# Patient Record
Sex: Male | Born: 1980 | Race: Black or African American | Hispanic: No | Marital: Married | Smoking: Current every day smoker
Health system: Southern US, Community
[De-identification: ages and names within clinical notes are randomized; demographics above are authoritative.]

---

## 2005-08-11 ENCOUNTER — Emergency Department (HOSPITAL_COMMUNITY): Admission: EM | Admit: 2005-08-11 | Discharge: 2005-08-11 | Payer: Self-pay | Admitting: Emergency Medicine

## 2005-08-14 ENCOUNTER — Ambulatory Visit (HOSPITAL_COMMUNITY): Admission: RE | Admit: 2005-08-14 | Discharge: 2005-08-14 | Payer: Self-pay | Admitting: Chiropractic Medicine

## 2005-09-08 ENCOUNTER — Encounter: Admission: RE | Admit: 2005-09-08 | Discharge: 2005-09-08 | Payer: Self-pay | Admitting: Specialist

## 2005-09-11 ENCOUNTER — Emergency Department (HOSPITAL_COMMUNITY): Admission: EM | Admit: 2005-09-11 | Discharge: 2005-09-12 | Payer: Self-pay | Admitting: Emergency Medicine

## 2005-09-11 IMAGING — CR DG ANKLE COMPLETE 3+V*R*
3 series · 3 of 3 positions shown · non-contrast
Comparison: none

CLINICAL DATA: Right ankle pain after injury.
 RIGHT ANKLE- 3 VIEW:

[t ankle joint ap right]
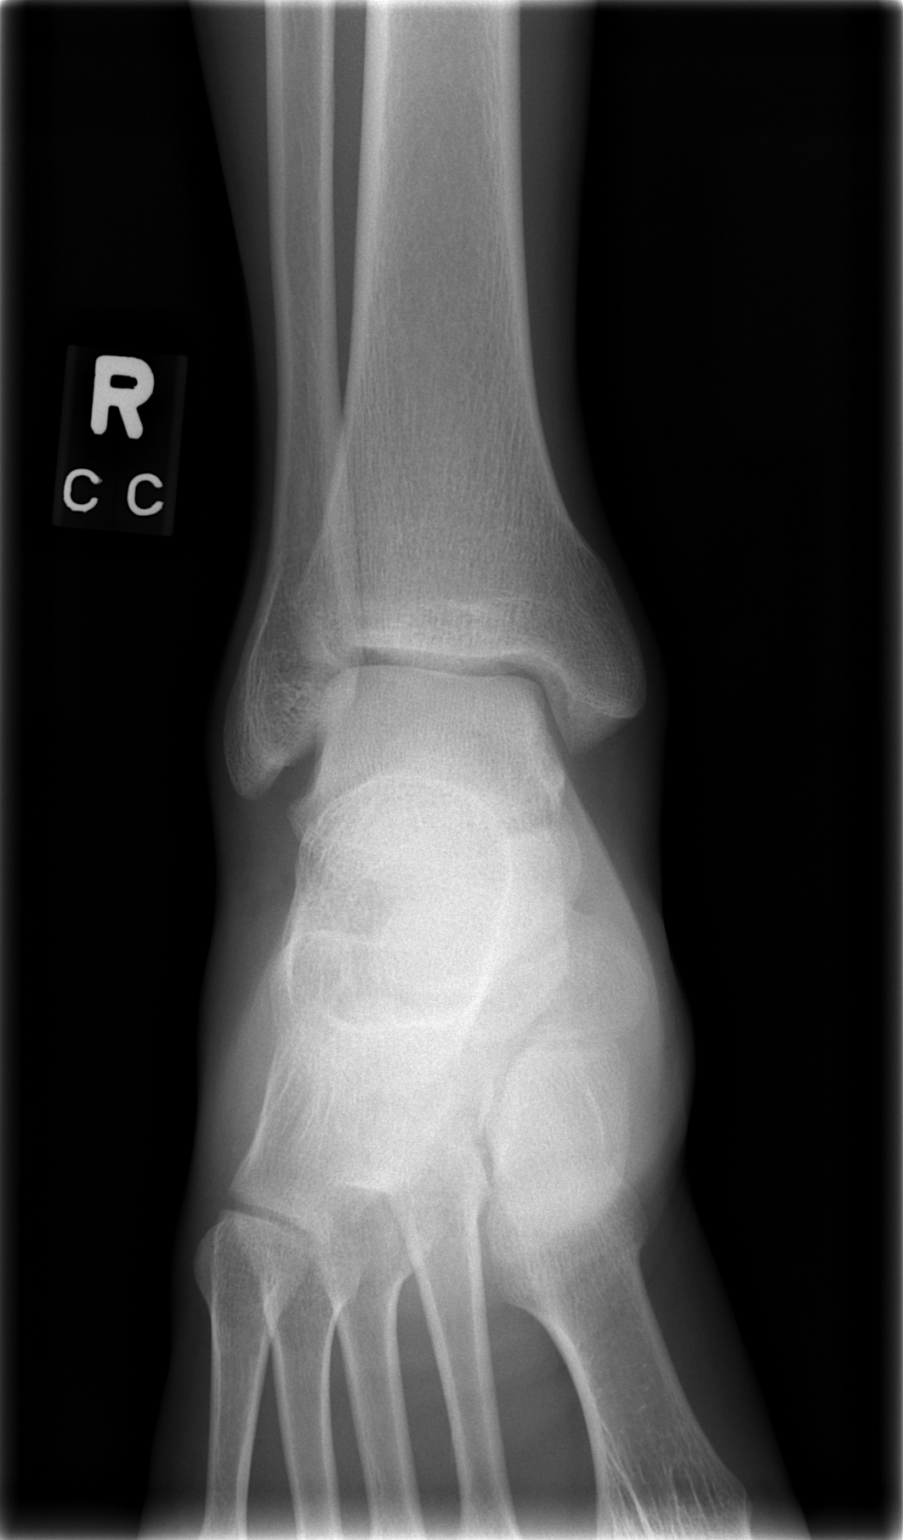

[t ankle joint oblique right]
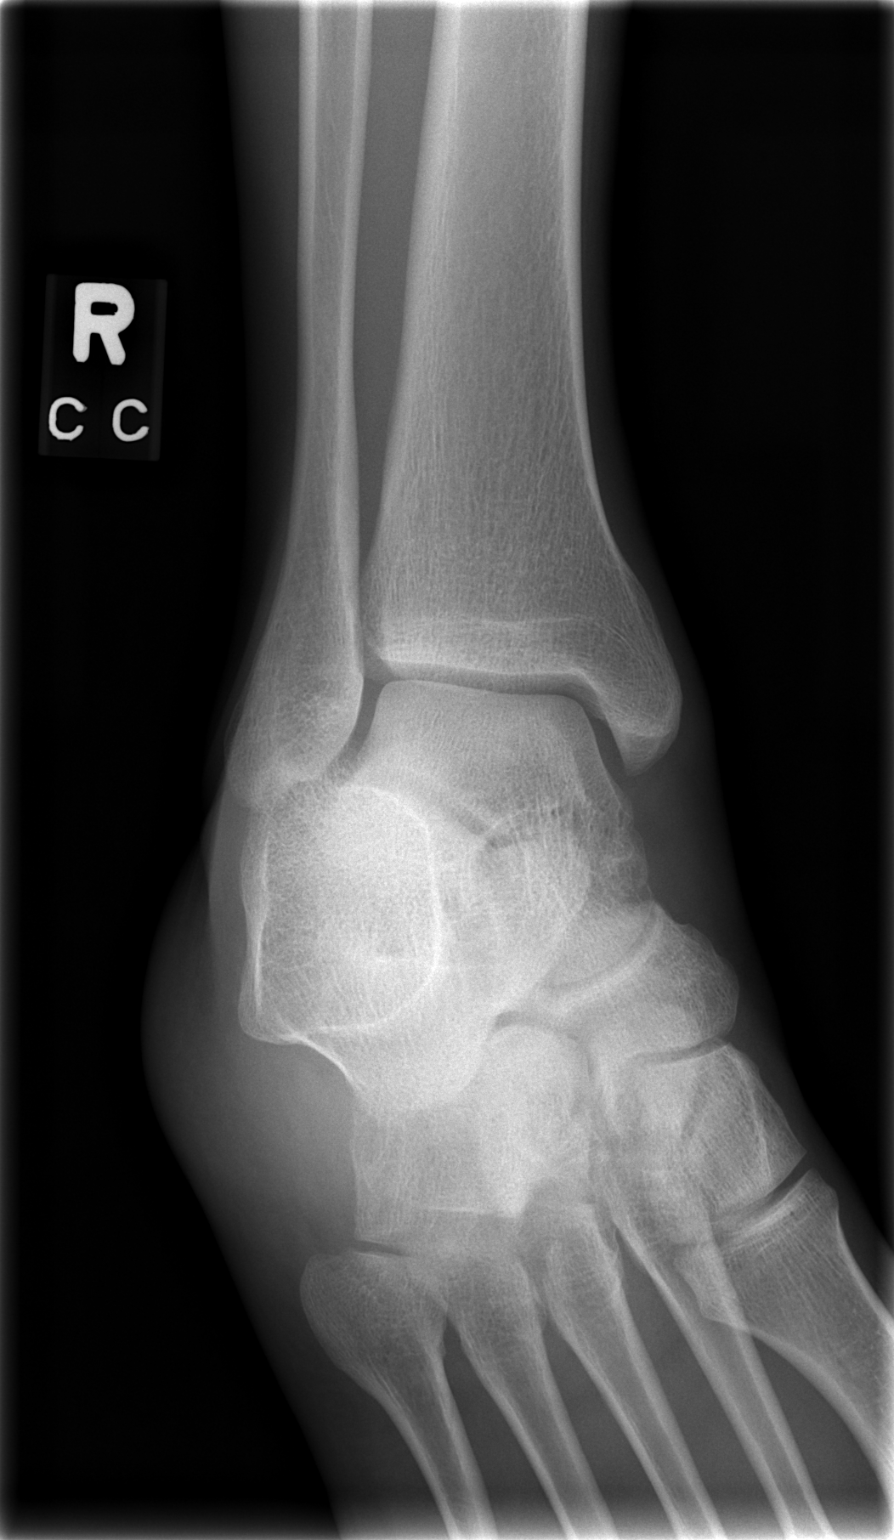

[t ankle joint lat right]
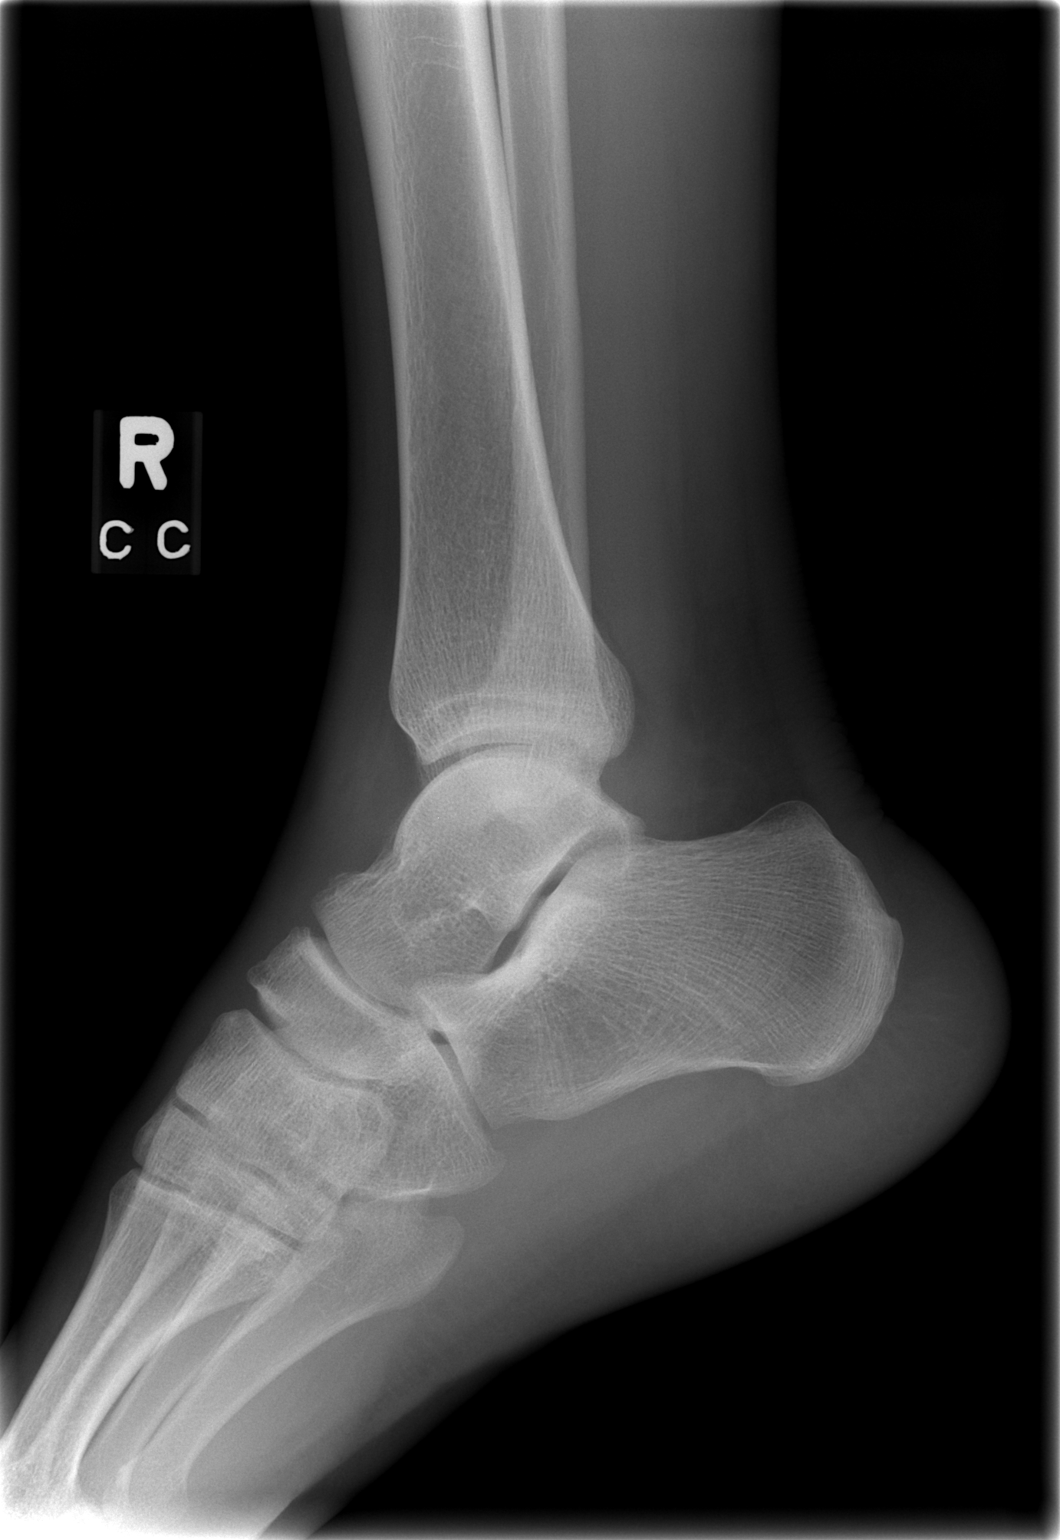

[3 of 3 positions shown; findings below may reference images not displayed]

FINDINGS: There is no evidence of fracture, dislocation, or joint effusion.  There is no evidence of arthropathy or other focal bone abnormality.  Soft tissues are unremarkable.
IMPRESSION: Negative.

## 2005-10-27 ENCOUNTER — Emergency Department (HOSPITAL_COMMUNITY): Admission: EM | Admit: 2005-10-27 | Discharge: 2005-10-27 | Payer: Self-pay | Admitting: *Deleted

## 2005-10-28 ENCOUNTER — Emergency Department (HOSPITAL_COMMUNITY): Admission: EM | Admit: 2005-10-28 | Discharge: 2005-10-28 | Payer: Self-pay | Admitting: Emergency Medicine

## 2006-05-15 ENCOUNTER — Ambulatory Visit: Payer: Self-pay | Admitting: Nurse Practitioner

## 2006-06-02 ENCOUNTER — Emergency Department (HOSPITAL_COMMUNITY): Admission: EM | Admit: 2006-06-02 | Discharge: 2006-06-02 | Payer: Self-pay | Admitting: Family Medicine

## 2006-07-30 ENCOUNTER — Emergency Department (HOSPITAL_COMMUNITY): Admission: EM | Admit: 2006-07-30 | Discharge: 2006-07-30 | Payer: Self-pay | Admitting: Emergency Medicine

## 2006-08-02 ENCOUNTER — Emergency Department (HOSPITAL_COMMUNITY): Admission: EM | Admit: 2006-08-02 | Discharge: 2006-08-02 | Payer: Self-pay | Admitting: Emergency Medicine

## 2006-12-15 ENCOUNTER — Encounter: Payer: Self-pay | Admitting: Infectious Diseases

## 2006-12-15 DIAGNOSIS — B199 Unspecified viral hepatitis without hepatic coma: Secondary | ICD-10-CM | POA: Insufficient documentation

## 2007-01-14 ENCOUNTER — Emergency Department (HOSPITAL_COMMUNITY): Admission: EM | Admit: 2007-01-14 | Discharge: 2007-01-14 | Payer: Self-pay | Admitting: Family Medicine

## 2007-04-05 ENCOUNTER — Emergency Department (HOSPITAL_COMMUNITY): Admission: EM | Admit: 2007-04-05 | Discharge: 2007-04-05 | Payer: Self-pay | Admitting: Emergency Medicine

## 2007-08-25 ENCOUNTER — Emergency Department (HOSPITAL_COMMUNITY): Admission: EM | Admit: 2007-08-25 | Discharge: 2007-08-25 | Payer: Self-pay | Admitting: Emergency Medicine

## 2007-11-12 ENCOUNTER — Emergency Department (HOSPITAL_COMMUNITY): Admission: EM | Admit: 2007-11-12 | Discharge: 2007-11-12 | Payer: Self-pay | Admitting: *Deleted

## 2007-12-16 ENCOUNTER — Ambulatory Visit: Payer: Self-pay | Admitting: Infectious Diseases

## 2007-12-16 ENCOUNTER — Encounter: Admission: RE | Admit: 2007-12-16 | Discharge: 2007-12-16 | Payer: Self-pay | Admitting: Infectious Diseases

## 2007-12-16 DIAGNOSIS — B20 Human immunodeficiency virus [HIV] disease: Secondary | ICD-10-CM | POA: Insufficient documentation

## 2007-12-16 DIAGNOSIS — B1910 Unspecified viral hepatitis B without hepatic coma: Secondary | ICD-10-CM | POA: Insufficient documentation

## 2007-12-16 LAB — CONVERTED CEMR LAB
ALT: 69 units/L — ABNORMAL HIGH (ref 0–53)
AST: 153 units/L — ABNORMAL HIGH (ref 0–37)
Albumin: 3.8 g/dL (ref 3.5–5.2)
Alkaline Phosphatase: 139 units/L — ABNORMAL HIGH (ref 39–117)
BUN: 14 mg/dL (ref 6–23)
Basophils Absolute: 0 10*3/uL (ref 0.0–0.1)
Basophils Relative: 1 % (ref 0–1)
Bilirubin Urine: NEGATIVE
CO2: 22 meq/L (ref 19–32)
Calcium: 9.1 mg/dL (ref 8.4–10.5)
Chlamydia, Swab/Urine, PCR: NEGATIVE
Chloride: 103 meq/L (ref 96–112)
Cholesterol: 189 mg/dL (ref 0–200)
Creatinine, Ser: 0.74 mg/dL (ref 0.40–1.50)
Eosinophils Absolute: 0 10*3/uL (ref 0.0–0.7)
Eosinophils Relative: 1 % (ref 0–5)
GC Probe Amp, Urine: NEGATIVE
Glucose, Bld: 86 mg/dL (ref 70–99)
HCT: 37.3 % — ABNORMAL LOW (ref 39.0–52.0)
HCV Ab: NEGATIVE
HCV Ab: NEGATIVE
HDL: 37 mg/dL — ABNORMAL LOW (ref >39)
HIV 1 RNA Quant: 724000 copies/mL — ABNORMAL HIGH (ref <50)
HIV-1 RNA Quant, Log: 5.86 — ABNORMAL HIGH (ref <1.70)
HIV-1 antibody: POSITIVE — AB
HIV-2 Ab: NEGATIVE
HIV: REACTIVE
Hemoglobin, Urine: NEGATIVE
Hemoglobin: 12.8 g/dL — ABNORMAL LOW (ref 13.0–17.0)
Hep A Total Ab: POSITIVE — AB
Hep B Core Total Ab: POSITIVE — AB
Hep B S Ab: NEGATIVE
Hep B S Ab: NEGATIVE
Hepatitis B Surface Ag: POSITIVE
Hepatitis B Surface Ag: POSITIVE — AB
Ketones, ur: NEGATIVE mg/dL
LDL Cholesterol: 131 mg/dL — ABNORMAL HIGH (ref 0–99)
Leukocytes, UA: NEGATIVE
Lymphocytes Relative: 54 % — ABNORMAL HIGH (ref 12–46)
Lymphs Abs: 1.5 10*3/uL (ref 0.7–4.0)
MCHC: 34.3 g/dL (ref 30.0–36.0)
MCV: 86.3 fL (ref 78.0–100.0)
Monocytes Absolute: 0.2 10*3/uL (ref 0.1–1.0)
Monocytes Relative: 9 % (ref 3–12)
Neutro Abs: 1 10*3/uL — ABNORMAL LOW (ref 1.7–7.7)
Neutrophils Relative %: 35 % — ABNORMAL LOW (ref 43–77)
Nitrite: NEGATIVE
Platelets: 151 10*3/uL (ref 150–400)
Potassium: 4.5 meq/L (ref 3.5–5.3)
Protein, ur: NEGATIVE mg/dL
RBC: 4.32 M/uL (ref 4.22–5.81)
RDW: 12.9 % (ref 11.5–15.5)
Sodium: 138 meq/L (ref 135–145)
Specific Gravity, Urine: 1.023 (ref 1.005–1.03)
Total Bilirubin: 0.8 mg/dL (ref 0.3–1.2)
Total CHOL/HDL Ratio: 5.1
Total Protein: 7.9 g/dL (ref 6.0–8.3)
Triglycerides: 104 mg/dL (ref <150)
Urine Glucose: NEGATIVE mg/dL
Urobilinogen, UA: 1 (ref 0.0–1.0)
VLDL: 21 mg/dL (ref 0–40)
WBC: 2.8 10*3/uL — ABNORMAL LOW (ref 4.0–10.5)
pH: 6 (ref 5.0–8.0)

## 2008-01-05 ENCOUNTER — Ambulatory Visit: Payer: Self-pay | Admitting: Infectious Diseases

## 2008-01-05 DIAGNOSIS — K649 Unspecified hemorrhoids: Secondary | ICD-10-CM | POA: Insufficient documentation

## 2008-01-05 DIAGNOSIS — B370 Candidal stomatitis: Secondary | ICD-10-CM | POA: Insufficient documentation

## 2008-01-18 ENCOUNTER — Encounter: Payer: Self-pay | Admitting: Infectious Diseases

## 2008-02-02 ENCOUNTER — Ambulatory Visit: Payer: Self-pay | Admitting: Infectious Diseases

## 2008-03-02 ENCOUNTER — Encounter (INDEPENDENT_AMBULATORY_CARE_PROVIDER_SITE_OTHER): Payer: Self-pay | Admitting: *Deleted

## 2008-03-06 ENCOUNTER — Telehealth: Payer: Self-pay | Admitting: Infectious Diseases

## 2008-04-05 ENCOUNTER — Ambulatory Visit: Payer: Self-pay | Admitting: Infectious Diseases

## 2008-04-05 ENCOUNTER — Encounter: Admission: RE | Admit: 2008-04-05 | Discharge: 2008-04-05 | Payer: Self-pay | Admitting: Infectious Diseases

## 2008-04-05 LAB — CONVERTED CEMR LAB
ALT: 26 units/L (ref 0–53)
AST: 39 units/L — ABNORMAL HIGH (ref 0–37)
Albumin: 4.5 g/dL (ref 3.5–5.2)
Alkaline Phosphatase: 128 units/L — ABNORMAL HIGH (ref 39–117)
BUN: 13 mg/dL (ref 6–23)
CO2: 18 meq/L — ABNORMAL LOW (ref 19–32)
Calcium: 9.7 mg/dL (ref 8.4–10.5)
Chloride: 108 meq/L (ref 96–112)
Cholesterol: 202 mg/dL — ABNORMAL HIGH (ref 0–200)
Creatinine, Ser: 0.81 mg/dL (ref 0.40–1.50)
Glucose, Bld: 102 mg/dL — ABNORMAL HIGH (ref 70–99)
HCT: 44.9 % (ref 39.0–52.0)
HDL: 47 mg/dL (ref >39)
HIV 1 RNA Quant: 148 copies/mL — ABNORMAL HIGH (ref <50)
HIV-1 RNA Quant, Log: 2.17 — ABNORMAL HIGH (ref <1.70)
Hemoglobin: 15.7 g/dL (ref 13.0–17.0)
LDL Cholesterol: 132 mg/dL — ABNORMAL HIGH (ref 0–99)
MCHC: 35 g/dL (ref 30.0–36.0)
MCV: 93.2 fL (ref 78.0–100.0)
Platelets: 190 10*3/uL (ref 150–400)
Potassium: 4.3 meq/L (ref 3.5–5.3)
RBC: 4.82 M/uL (ref 4.22–5.81)
RDW: 12.7 % (ref 11.5–15.5)
Sodium: 142 meq/L (ref 135–145)
Total Bilirubin: 0.3 mg/dL (ref 0.3–1.2)
Total CHOL/HDL Ratio: 4.3
Total Protein: 7.7 g/dL (ref 6.0–8.3)
Triglycerides: 115 mg/dL (ref <150)
VLDL: 23 mg/dL (ref 0–40)
WBC: 6 10*3/uL (ref 4.0–10.5)

## 2008-04-19 ENCOUNTER — Telehealth: Payer: Self-pay | Admitting: Infectious Diseases

## 2008-06-15 ENCOUNTER — Telehealth: Payer: Self-pay

## 2008-06-16 ENCOUNTER — Ambulatory Visit: Payer: Self-pay | Admitting: Internal Medicine

## 2008-06-16 DIAGNOSIS — F1994 Other psychoactive substance use, unspecified with psychoactive substance-induced mood disorder: Secondary | ICD-10-CM | POA: Insufficient documentation

## 2008-06-30 ENCOUNTER — Ambulatory Visit: Payer: Self-pay | Admitting: Internal Medicine

## 2008-06-30 DIAGNOSIS — R21 Rash and other nonspecific skin eruption: Secondary | ICD-10-CM | POA: Insufficient documentation

## 2008-07-03 ENCOUNTER — Telehealth: Payer: Self-pay

## 2008-07-26 ENCOUNTER — Telehealth: Payer: Self-pay | Admitting: Infectious Diseases

## 2008-07-27 ENCOUNTER — Encounter: Payer: Self-pay | Admitting: Infectious Diseases

## 2008-07-27 ENCOUNTER — Encounter (INDEPENDENT_AMBULATORY_CARE_PROVIDER_SITE_OTHER): Payer: Self-pay | Admitting: *Deleted

## 2008-08-08 ENCOUNTER — Encounter (INDEPENDENT_AMBULATORY_CARE_PROVIDER_SITE_OTHER): Payer: Self-pay | Admitting: *Deleted

## 2008-08-21 ENCOUNTER — Ambulatory Visit: Payer: Self-pay | Admitting: Infectious Diseases

## 2008-08-21 ENCOUNTER — Encounter: Payer: Self-pay | Admitting: Internal Medicine

## 2008-08-21 LAB — CONVERTED CEMR LAB
ALT: 30 units/L (ref 0–53)
AST: 23 units/L (ref 0–37)
Albumin: 4.3 g/dL (ref 3.5–5.2)
Alkaline Phosphatase: 96 units/L (ref 39–117)
BUN: 12 mg/dL (ref 6–23)
Basophils Absolute: 0 10*3/uL (ref 0.0–0.1)
Basophils Relative: 1 % (ref 0–1)
CO2: 24 meq/L (ref 19–32)
Calcium: 9.6 mg/dL (ref 8.4–10.5)
Chloride: 103 meq/L (ref 96–112)
Creatinine, Ser: 0.95 mg/dL (ref 0.40–1.50)
Eosinophils Absolute: 0.3 10*3/uL (ref 0.0–0.7)
Eosinophils Relative: 5 % (ref 0–5)
Glucose, Bld: 99 mg/dL (ref 70–99)
HCT: 44.3 % (ref 39.0–52.0)
HIV 1 RNA Quant: 635 copies/mL — ABNORMAL HIGH (ref <50)
HIV-1 RNA Quant, Log: 2.8 — ABNORMAL HIGH (ref <1.70)
Hemoglobin: 15.5 g/dL (ref 13.0–17.0)
Lymphocytes Relative: 46 % (ref 12–46)
Lymphs Abs: 2.4 10*3/uL (ref 0.7–4.0)
MCHC: 35 g/dL (ref 30.0–36.0)
MCV: 90.2 fL (ref 78.0–100.0)
Monocytes Absolute: 0.4 10*3/uL (ref 0.1–1.0)
Monocytes Relative: 8 % (ref 3–12)
Neutro Abs: 2 10*3/uL (ref 1.7–7.7)
Neutrophils Relative %: 40 % — ABNORMAL LOW (ref 43–77)
Platelets: 159 10*3/uL (ref 150–400)
Potassium: 4.3 meq/L (ref 3.5–5.3)
RBC: 4.91 M/uL (ref 4.22–5.81)
RDW: 12.5 % (ref 11.5–15.5)
Sodium: 140 meq/L (ref 135–145)
Total Bilirubin: 2 mg/dL — ABNORMAL HIGH (ref 0.3–1.2)
Total Protein: 7.3 g/dL (ref 6.0–8.3)
WBC: 5.1 10*3/uL (ref 4.0–10.5)

## 2008-09-06 ENCOUNTER — Ambulatory Visit: Payer: Self-pay | Admitting: Infectious Diseases

## 2008-09-25 ENCOUNTER — Encounter: Payer: Self-pay | Admitting: Infectious Diseases

## 2009-04-03 ENCOUNTER — Ambulatory Visit: Payer: Self-pay | Admitting: Infectious Diseases

## 2009-04-03 LAB — CONVERTED CEMR LAB
ALT: 70 units/L — ABNORMAL HIGH (ref 0–53)
AST: 76 units/L — ABNORMAL HIGH (ref 0–37)
Albumin: 3.9 g/dL (ref 3.5–5.2)
Alkaline Phosphatase: 112 units/L (ref 39–117)
BUN: 12 mg/dL (ref 6–23)
Basophils Absolute: 0 10*3/uL (ref 0.0–0.1)
Basophils Relative: 0 % (ref 0–1)
CO2: 26 meq/L (ref 19–32)
Calcium: 9.1 mg/dL (ref 8.4–10.5)
Chloride: 104 meq/L (ref 96–112)
Cholesterol: 130 mg/dL (ref 0–200)
Creatinine, Ser: 0.91 mg/dL (ref 0.40–1.50)
Eosinophils Absolute: 0 10*3/uL (ref 0.0–0.7)
Eosinophils Relative: 0 % (ref 0–5)
GFR calc Af Amer: >60 mL/min (ref >60)
GFR calc non Af Amer: >60 mL/min (ref >60)
Glucose, Bld: 97 mg/dL (ref 70–99)
HCT: 41.6 % (ref 39.0–52.0)
HDL: 28 mg/dL — ABNORMAL LOW (ref >39)
HIV 1 RNA Quant: 367000 copies/mL — ABNORMAL HIGH (ref <48)
HIV-1 RNA Quant, Log: 5.56 — ABNORMAL HIGH (ref <1.68)
Hemoglobin: 14.3 g/dL (ref 13.0–17.0)
LDL Cholesterol: 83 mg/dL (ref 0–99)
Lymphocytes Relative: 46 % (ref 12–46)
Lymphs Abs: 1.7 10*3/uL (ref 0.7–4.0)
MCHC: 34.4 g/dL (ref 30.0–36.0)
MCV: 87.2 fL (ref 78.0–100.0)
Monocytes Absolute: 0.7 10*3/uL (ref 0.1–1.0)
Monocytes Relative: 19 % — ABNORMAL HIGH (ref 3–12)
Neutro Abs: 1.3 10*3/uL — ABNORMAL LOW (ref 1.7–7.7)
Neutrophils Relative %: 35 % — ABNORMAL LOW (ref 43–77)
Platelets: 212 10*3/uL (ref 150–400)
Potassium: 4.1 meq/L (ref 3.5–5.3)
RBC: 4.77 M/uL (ref 4.22–5.81)
RDW: 12 % (ref 11.5–15.5)
Sodium: 138 meq/L (ref 135–145)
Total Bilirubin: 0.4 mg/dL (ref 0.3–1.2)
Total CHOL/HDL Ratio: 4.6
Total Protein: 7.6 g/dL (ref 6.0–8.3)
Triglycerides: 96 mg/dL (ref <150)
VLDL: 19 mg/dL (ref 0–40)
WBC: 3.6 10*3/uL — ABNORMAL LOW (ref 4.0–10.5)

## 2009-04-16 ENCOUNTER — Ambulatory Visit: Payer: Self-pay | Admitting: Infectious Diseases

## 2009-05-11 ENCOUNTER — Encounter (INDEPENDENT_AMBULATORY_CARE_PROVIDER_SITE_OTHER): Payer: Self-pay | Admitting: *Deleted

## 2009-05-23 ENCOUNTER — Telehealth (INDEPENDENT_AMBULATORY_CARE_PROVIDER_SITE_OTHER): Payer: Self-pay | Admitting: *Deleted

## 2009-05-28 ENCOUNTER — Ambulatory Visit: Payer: Self-pay | Admitting: Infectious Diseases

## 2009-05-28 LAB — CONVERTED CEMR LAB
HIV 1 RNA Quant: 1700 copies/mL — ABNORMAL HIGH (ref <48)
HIV-1 RNA Quant, Log: 3.23 — ABNORMAL HIGH (ref <1.68)

## 2009-05-30 ENCOUNTER — Encounter: Payer: Self-pay | Admitting: Infectious Diseases

## 2009-05-30 LAB — CONVERTED CEMR LAB
ALT: 299 units/L — ABNORMAL HIGH (ref 0–53)
AST: 238 units/L — ABNORMAL HIGH (ref 0–37)
Albumin: 3.7 g/dL (ref 3.5–5.2)
Alkaline Phosphatase: 153 units/L — ABNORMAL HIGH (ref 39–117)
BUN: 9 mg/dL (ref 6–23)
Basophils Absolute: 0.1 10*3/uL (ref 0.0–0.1)
Basophils Relative: 1 % (ref 0–1)
CO2: 23 meq/L (ref 19–32)
Calcium: 8.8 mg/dL (ref 8.4–10.5)
Chloride: 106 meq/L (ref 96–112)
Creatinine, Ser: 0.77 mg/dL (ref 0.40–1.50)
Eosinophils Absolute: 0.2 10*3/uL (ref 0.0–0.7)
Eosinophils Relative: 2 % (ref 0–5)
Glucose, Bld: 75 mg/dL (ref 70–99)
HCT: 39.3 % (ref 39.0–52.0)
Hemoglobin: 13.3 g/dL (ref 13.0–17.0)
Lymphocytes Relative: 42 % (ref 12–46)
Lymphs Abs: 2.6 10*3/uL (ref 0.7–4.0)
MCHC: 33.8 g/dL (ref 30.0–36.0)
MCV: 88.1 fL (ref 78.0–100.0)
Monocytes Absolute: 0.6 10*3/uL (ref 0.1–1.0)
Monocytes Relative: 10 % (ref 3–12)
Neutro Abs: 2.8 10*3/uL (ref 1.7–7.7)
Neutrophils Relative %: 45 % (ref 43–77)
Platelets: 189 10*3/uL (ref 150–400)
Potassium: 4.2 meq/L (ref 3.5–5.3)
RBC: 4.46 M/uL (ref 4.22–5.81)
RDW: 19.1 % — ABNORMAL HIGH (ref 11.5–15.5)
Sodium: 138 meq/L (ref 135–145)
Total Bilirubin: 3.4 mg/dL — ABNORMAL HIGH (ref 0.3–1.2)
Total Protein: 8.1 g/dL (ref 6.0–8.3)
WBC: 6.2 10*3/uL (ref 4.0–10.5)

## 2009-06-05 ENCOUNTER — Telehealth: Payer: Self-pay | Admitting: Infectious Diseases

## 2009-06-05 ENCOUNTER — Ambulatory Visit: Payer: Self-pay | Admitting: Infectious Diseases

## 2009-06-05 LAB — CONVERTED CEMR LAB
ALT: 109 units/L — ABNORMAL HIGH (ref 0–53)
AST: 92 units/L — ABNORMAL HIGH (ref 0–37)
Albumin: 3.3 g/dL — ABNORMAL LOW (ref 3.5–5.2)
Alkaline Phosphatase: 134 units/L — ABNORMAL HIGH (ref 39–117)
Bilirubin, Direct: 0.9 mg/dL — ABNORMAL HIGH (ref 0.0–0.3)
Indirect Bilirubin: 2 mg/dL — ABNORMAL HIGH (ref 0.0–0.9)
Total Bilirubin: 2.9 mg/dL — ABNORMAL HIGH (ref 0.3–1.2)
Total Protein: 7.4 g/dL (ref 6.0–8.3)

## 2009-06-19 ENCOUNTER — Encounter (INDEPENDENT_AMBULATORY_CARE_PROVIDER_SITE_OTHER): Payer: Self-pay | Admitting: *Deleted

## 2009-07-05 ENCOUNTER — Telehealth (INDEPENDENT_AMBULATORY_CARE_PROVIDER_SITE_OTHER): Payer: Self-pay | Admitting: *Deleted

## 2009-07-16 ENCOUNTER — Ambulatory Visit: Payer: Self-pay | Admitting: Infectious Diseases

## 2009-07-16 LAB — CONVERTED CEMR LAB
ALT: 23 units/L (ref 0–53)
AST: 31 units/L (ref 0–37)
Albumin: 3.8 g/dL (ref 3.5–5.2)
Alkaline Phosphatase: 98 units/L (ref 39–117)
BUN: 12 mg/dL (ref 6–23)
Basophils Absolute: 0.1 10*3/uL (ref 0.0–0.1)
Basophils Relative: 1 % (ref 0–1)
CO2: 22 meq/L (ref 19–32)
Calcium: 9.6 mg/dL (ref 8.4–10.5)
Chloride: 105 meq/L (ref 96–112)
Creatinine, Ser: 0.91 mg/dL (ref 0.40–1.50)
Eosinophils Absolute: 0.2 10*3/uL (ref 0.0–0.7)
Eosinophils Relative: 6 % — ABNORMAL HIGH (ref 0–5)
Glucose, Bld: 87 mg/dL (ref 70–99)
HCT: 43.1 % (ref 39.0–52.0)
HIV 1 RNA Quant: 727 copies/mL — ABNORMAL HIGH (ref <48)
HIV-1 RNA Quant, Log: 2.86 — ABNORMAL HIGH (ref <1.68)
Hemoglobin: 15.3 g/dL (ref 13.0–17.0)
Lymphocytes Relative: 46 % (ref 12–46)
Lymphs Abs: 1.7 10*3/uL (ref 0.7–4.0)
MCHC: 35.5 g/dL (ref 30.0–36.0)
MCV: 93.9 fL (ref >=78.0)
Monocytes Absolute: 0.4 10*3/uL (ref 0.1–1.0)
Monocytes Relative: 11 % (ref 3–12)
Neutro Abs: 1.3 10*3/uL — ABNORMAL LOW (ref 1.7–7.7)
Neutrophils Relative %: 36 % — ABNORMAL LOW (ref 43–77)
Platelets: 124 10*3/uL — ABNORMAL LOW (ref 150–400)
Potassium: 4.2 meq/L (ref 3.5–5.3)
RBC: 4.59 M/uL (ref 4.22–5.81)
RDW: 13.1 % (ref 11.5–15.5)
Sodium: 137 meq/L (ref 135–145)
Total Bilirubin: 1.5 mg/dL — ABNORMAL HIGH (ref 0.3–1.2)
Total Protein: 7.1 g/dL (ref 6.0–8.3)
WBC: 3.6 10*3/uL — ABNORMAL LOW (ref 4.0–10.5)

## 2009-08-09 ENCOUNTER — Telehealth (INDEPENDENT_AMBULATORY_CARE_PROVIDER_SITE_OTHER): Payer: Self-pay | Admitting: *Deleted

## 2009-09-03 ENCOUNTER — Telehealth (INDEPENDENT_AMBULATORY_CARE_PROVIDER_SITE_OTHER): Payer: Self-pay | Admitting: *Deleted

## 2009-09-05 ENCOUNTER — Telehealth (INDEPENDENT_AMBULATORY_CARE_PROVIDER_SITE_OTHER): Payer: Self-pay | Admitting: *Deleted

## 2009-09-20 ENCOUNTER — Telehealth (INDEPENDENT_AMBULATORY_CARE_PROVIDER_SITE_OTHER): Payer: Self-pay | Admitting: *Deleted

## 2009-10-04 ENCOUNTER — Telehealth (INDEPENDENT_AMBULATORY_CARE_PROVIDER_SITE_OTHER): Payer: Self-pay | Admitting: *Deleted

## 2009-10-22 ENCOUNTER — Ambulatory Visit: Payer: Self-pay | Admitting: Infectious Diseases

## 2009-10-22 LAB — CONVERTED CEMR LAB
ALT: 23 units/L (ref 0–53)
AST: 25 units/L (ref 0–37)
Albumin: 4.2 g/dL (ref 3.5–5.2)
Alkaline Phosphatase: 114 units/L (ref 39–117)
BUN: 11 mg/dL (ref 6–23)
Basophils Absolute: 0 10*3/uL (ref 0.0–0.1)
Basophils Relative: 1 % (ref 0–1)
CO2: 19 meq/L (ref 19–32)
Calcium: 9.5 mg/dL (ref 8.4–10.5)
Chloride: 104 meq/L (ref 96–112)
Creatinine, Ser: 0.96 mg/dL (ref 0.40–1.50)
Eosinophils Absolute: 0.2 10*3/uL (ref 0.0–0.7)
Eosinophils Relative: 4 % (ref 0–5)
Glucose, Bld: 99 mg/dL (ref 70–99)
HCT: 46.1 % (ref 39.0–52.0)
HIV 1 RNA Quant: 107 copies/mL — ABNORMAL HIGH (ref <48)
HIV-1 RNA Quant, Log: 2.03 — ABNORMAL HIGH (ref <1.68)
Hemoglobin: 16.4 g/dL (ref 13.0–17.0)
Lymphocytes Relative: 45 % (ref 12–46)
Lymphs Abs: 2.4 10*3/uL (ref 0.7–4.0)
MCHC: 35.6 g/dL (ref 30.0–36.0)
MCV: 90.7 fL (ref >=78.0)
Monocytes Absolute: 0.5 10*3/uL (ref 0.1–1.0)
Monocytes Relative: 9 % (ref 3–12)
Neutro Abs: 2.2 10*3/uL (ref 1.7–7.7)
Neutrophils Relative %: 41 % — ABNORMAL LOW (ref 43–77)
Platelets: 124 10*3/uL — ABNORMAL LOW (ref 150–400)
Potassium: 4.1 meq/L (ref 3.5–5.3)
RBC: 5.08 M/uL (ref 4.22–5.81)
RDW: 12.6 % (ref 11.5–15.5)
Sodium: 138 meq/L (ref 135–145)
Total Bilirubin: 1.8 mg/dL — ABNORMAL HIGH (ref 0.3–1.2)
Total Protein: 6.9 g/dL (ref 6.0–8.3)
WBC: 5.4 10*3/uL (ref 4.0–10.5)

## 2009-11-01 ENCOUNTER — Telehealth (INDEPENDENT_AMBULATORY_CARE_PROVIDER_SITE_OTHER): Payer: Self-pay | Admitting: *Deleted

## 2009-11-07 ENCOUNTER — Ambulatory Visit: Payer: Self-pay | Admitting: Infectious Diseases

## 2009-12-05 ENCOUNTER — Telehealth (INDEPENDENT_AMBULATORY_CARE_PROVIDER_SITE_OTHER): Payer: Self-pay | Admitting: *Deleted

## 2009-12-14 ENCOUNTER — Encounter (INDEPENDENT_AMBULATORY_CARE_PROVIDER_SITE_OTHER): Payer: Self-pay | Admitting: *Deleted

## 2010-01-01 ENCOUNTER — Telehealth (INDEPENDENT_AMBULATORY_CARE_PROVIDER_SITE_OTHER): Payer: Self-pay | Admitting: *Deleted

## 2010-02-05 ENCOUNTER — Telehealth (INDEPENDENT_AMBULATORY_CARE_PROVIDER_SITE_OTHER): Payer: Self-pay | Admitting: *Deleted

## 2010-02-11 ENCOUNTER — Encounter: Payer: Self-pay | Admitting: Infectious Diseases

## 2010-03-06 ENCOUNTER — Telehealth (INDEPENDENT_AMBULATORY_CARE_PROVIDER_SITE_OTHER): Payer: Self-pay | Admitting: *Deleted

## 2010-03-27 ENCOUNTER — Telehealth (INDEPENDENT_AMBULATORY_CARE_PROVIDER_SITE_OTHER): Payer: Self-pay | Admitting: *Deleted

## 2010-04-24 ENCOUNTER — Telehealth (INDEPENDENT_AMBULATORY_CARE_PROVIDER_SITE_OTHER): Payer: Self-pay | Admitting: *Deleted

## 2010-05-02 ENCOUNTER — Ambulatory Visit: Payer: Self-pay | Admitting: Infectious Diseases

## 2010-05-02 LAB — CONVERTED CEMR LAB
ALT: 18 units/L (ref 0–53)
AST: 20 units/L (ref 0–37)
Albumin: 4.3 g/dL (ref 3.5–5.2)
Alkaline Phosphatase: 95 units/L (ref 39–117)
BUN: 14 mg/dL (ref 6–23)
Basophils Absolute: 0 10*3/uL (ref 0.0–0.1)
Basophils Relative: 1 % (ref 0–1)
CO2: 20 meq/L (ref 19–32)
Calcium: 9.2 mg/dL (ref 8.4–10.5)
Chloride: 107 meq/L (ref 96–112)
Cholesterol: 159 mg/dL (ref 0–200)
Creatinine, Ser: 0.93 mg/dL (ref 0.40–1.50)
Eosinophils Absolute: 0.2 10*3/uL (ref 0.0–0.7)
Eosinophils Relative: 4 % (ref 0–5)
Glucose, Bld: 90 mg/dL (ref 70–99)
HCT: 45.1 % (ref 39.0–52.0)
HDL: 50 mg/dL (ref >39)
HIV 1 RNA Quant: 74 copies/mL — ABNORMAL HIGH (ref <48)
HIV-1 RNA Quant, Log: 1.87 — ABNORMAL HIGH (ref <1.68)
Hemoglobin: 16.2 g/dL (ref 13.0–17.0)
LDL Cholesterol: 98 mg/dL (ref 0–99)
Lymphocytes Relative: 42 % (ref 12–46)
Lymphs Abs: 2 10*3/uL (ref 0.7–4.0)
MCHC: 35.9 g/dL (ref 30.0–36.0)
MCV: 90.4 fL (ref 78.0–100.0)
Monocytes Absolute: 0.3 10*3/uL (ref 0.1–1.0)
Monocytes Relative: 7 % (ref 3–12)
Neutro Abs: 2.2 10*3/uL (ref 1.7–7.7)
Neutrophils Relative %: 46 % (ref 43–77)
Platelets: 118 10*3/uL — ABNORMAL LOW (ref 150–400)
Potassium: 4.2 meq/L (ref 3.5–5.3)
RBC: 4.99 M/uL (ref 4.22–5.81)
RDW: 12.4 % (ref 11.5–15.5)
Sodium: 140 meq/L (ref 135–145)
Total Bilirubin: 0.9 mg/dL (ref 0.3–1.2)
Total CHOL/HDL Ratio: 3.2
Total Protein: 7.1 g/dL (ref 6.0–8.3)
Triglycerides: 54 mg/dL (ref <150)
VLDL: 11 mg/dL (ref 0–40)
WBC: 4.7 10*3/uL (ref 4.0–10.5)

## 2010-05-20 ENCOUNTER — Ambulatory Visit: Payer: Self-pay | Admitting: Infectious Diseases

## 2010-08-21 ENCOUNTER — Encounter (INDEPENDENT_AMBULATORY_CARE_PROVIDER_SITE_OTHER): Payer: Self-pay | Admitting: *Deleted

## 2010-10-28 ENCOUNTER — Encounter (INDEPENDENT_AMBULATORY_CARE_PROVIDER_SITE_OTHER): Payer: Self-pay | Admitting: *Deleted

## 2010-12-04 ENCOUNTER — Telehealth (INDEPENDENT_AMBULATORY_CARE_PROVIDER_SITE_OTHER): Payer: Self-pay | Admitting: *Deleted

## 2010-12-31 NOTE — Miscellaneous (Signed)
Summary: RW Update  Clinical Lists Changes  Observations: Added new observation of HIV RISK BEH: Heterosexual contact (12/14/2009 14:59)

## 2010-12-31 NOTE — Progress Notes (Signed)
Summary: [place]/pt assist meds arrived for Apr/May  Phone Note Refill Request      .soPrescriptions: REYATAZ 300 MG  CAPS (ATAZANAVIR SULFATE) Take 1 tablet by mouth once a day  #30 x 0   Entered by:   Paulo Fruit  BS,CPht II,MPH   Authorized by:   Johny Sax MD   Signed by:   Paulo Fruit  BS,CPht II,MPH on 03/27/2010   Method used:   Samples Given   RxID:   [id number] NORVIR 100 MG TABS (RITONAVIR) Take 1 tablet by mouth once a day  #30 x 0   Entered by:   Paulo Fruit  BS,CPht II,MPH   Authorized by:   Johny Sax MD   Signed by:   Paulo Fruit  BS,CPht II,MPH on 03/27/2010   Method used:   Samples Given   RxID:   [id number] TRUVADA 200-300 MG  TABS (EMTRICITABINE-TENOFOVIR) Take 1 tablet by mouth once a day  #30 x 0   Entered by:   Paulo Fruit  BS,CPht II,MPH   Authorized by:   Johny Sax MD   Signed by:   Paulo Fruit  BS,CPht II,MPH on 03/27/2010   Method used:   Samples Given   RxID:   [id number]   Patient Assist Medication Verification: Medication: Truvada Lot# [id number] Exp Date:09 2014 Tech approval:MLD                Patient Assist Medication Verification: Medication:Reyataz 300mg  Lot# [id number] Exp Date:Jan 2013 Tech approval:MLD                Patient Assist Medication Verification: Medication:Norvir 100mg  Lot# [id number] Exp Date:02 Aug 2011 Tech approval:MLD Call placed to patient with message that assistance medications are ready for pick-up. Left message on VM for patient to contact [facility] @ [phone] Paulo Fruit  BS,CPht II,MPH  March 27, 2010 10:54 AM                 Appended Document: [place]/pt assist meds arrived for Apr/May Prescription/Samples picked up by: patient

## 2010-12-31 NOTE — Miscellaneous (Signed)
Summary: clinical update/ryan white  Clinical Lists Changes  Observations: Added new observation of AIDSDAP: Yes 2011 (08/21/2010 14:48) Added new observation of PCTFPL: 81.14  (08/21/2010 14:48) Added new observation of INCOMESOURCE: unemployment  (08/21/2010 14:48) Added new observation of HOUSEINCOME: 8788  (08/21/2010 14:48) Added new observation of FINASSESSDT: 08/15/2010  (08/21/2010 14:48)

## 2010-12-31 NOTE — Letter (Signed)
Summary: Wesley Pearson: Income Verification  Wesley Pearson: Income Verification   Imported By: Florinda Marker 02/21/2010 15:42:46  _____________________________________________________________________  External Attachment:    Type:   Image     Comment:   External Document

## 2010-12-31 NOTE — Progress Notes (Signed)
Summary: [place]/pt assist meds arrived for May  Phone Note Refill Request      Prescriptions: REYATAZ 300 MG  CAPS (ATAZANAVIR SULFATE) Take 1 tablet by mouth once a day  #30 x 0   Entered by:   Paulo Fruit  BS,CPht II,MPH   Authorized by:   Johny Sax MD   Signed by:   Paulo Fruit  BS,CPht II,MPH on 04/24/2010   Method used:   Samples Given   RxID:   [id number] NORVIR 100 MG TABS (RITONAVIR) Take 1 tablet by mouth once a day  #30 x 0   Entered by:   Paulo Fruit  BS,CPht II,MPH   Authorized by:   Johny Sax MD   Signed by:   Paulo Fruit  BS,CPht II,MPH on 04/24/2010   Method used:   Samples Given   RxID:   [id number] TRUVADA 200-300 MG  TABS (EMTRICITABINE-TENOFOVIR) Take 1 tablet by mouth once a day  #30 x 0   Entered by:   Paulo Fruit  BS,CPht II,MPH   Authorized by:   Johny Sax MD   Signed by:   Paulo Fruit  BS,CPht II,MPH on 04/24/2010   Method used:   Samples Given   RxID:   [id number]   Patient Assist Medication Verification: Medication: Truvada Lot# [id number] Exp Date:11 2014 Tech approval:MLD                Patient Assist Medication Verification: Medication:Reyataz 300mg  Lot# [id number] Exp Date:Mar 2013 Tech approval:MLD                Patient Assist Medication Verification: Medication:Norvir 100mg  KGU#[id number] Exp Date:05 Dec 2011 Tech approval:MLD               Tried to contact patient; at the time call was placed, was unable to leave a message because patient phone set to [place].  He has not come to pick up medications since April.

## 2010-12-31 NOTE — Miscellaneous (Signed)
  Clinical Lists Changes  Observations: Added new observation of YEARAIDSPOS: 2008  (10/28/2010 16:35)

## 2010-12-31 NOTE — Progress Notes (Signed)
Summary: [place]/pt assist meds arrived for Apr  Phone Note Refill Request      Prescriptions: REYATAZ 300 MG  CAPS (ATAZANAVIR SULFATE) Take 1 tablet by mouth once a day  #30 x 0   Entered by:   Paulo Fruit  BS,CPht II,MPH   Authorized by:   Johny Sax MD   Signed by:   Paulo Fruit  BS,CPht II,MPH on 03/06/2010   Method used:   Samples Given   RxID:   [id number] NORVIR 100 MG TABS (RITONAVIR) Take 1 tablet by mouth once a day  #30 x 0   Entered by:   Paulo Fruit  BS,CPht II,MPH   Authorized by:   Johny Sax MD   Signed by:   Paulo Fruit  BS,CPht II,MPH on 03/06/2010   Method used:   Samples Given   RxID:   [id number] TRUVADA 200-300 MG  TABS (EMTRICITABINE-TENOFOVIR) Take 1 tablet by mouth once a day  #30 x 0   Entered by:   Paulo Fruit  BS,CPht II,MPH   Authorized by:   Johny Sax MD   Signed by:   Paulo Fruit  BS,CPht II,MPH on 03/06/2010   Method used:   Samples Given   RxID:   [id number]   Patient Assist Medication Verification: Medication: Norvir 100mg  Lot# [id number] Exp Date:01 Sep 2011 Tech approval:MLD                Patient Assist Medication Verification: Medication:Truvada Lot# [id number] Exp Date:09 2014 Tech approval:MLD                Patient Assist Medication Verification: Medication:Reyataz 300mg  Lot# [id number] Exp Date:Jan 2013 Tech approval:MLD Call placed to patient with message that assistance medications are ready for pick-up. Paulo Fruit  BS,CPht II,MPH  March 06, 2010 12:10 PM

## 2010-12-31 NOTE — Progress Notes (Signed)
Summary: [facility]ADAP/pt assist meds arrived for Feb  Phone Note Refill Request      Prescriptions: REYATAZ 300 MG  CAPS (ATAZANAVIR SULFATE) Take 1 tablet by mouth once a day  #30 x 0   Entered by:   Paulo Fruit  BS,CPht II,MPH   Authorized by:   Johny Sax MD   Signed by:   Paulo Fruit  BS,CPht II,MPH on 01/01/2010   Method used:   Samples Given   RxID:   [id number] NORVIR 100 MG TABS (RITONAVIR) Take 1 tablet by mouth once a day  #30 x 0   Entered by:   Paulo Fruit  BS,CPht II,MPH   Authorized by:   Johny Sax MD   Signed by:   Paulo Fruit  BS,CPht II,MPH on 01/01/2010   Method used:   Samples Given   RxID:   [id number] TRUVADA 200-300 MG  TABS (EMTRICITABINE-TENOFOVIR) Take 1 tablet by mouth once a day  #30 x 0   Entered by:   Paulo Fruit  BS,CPht II,MPH   Authorized by:   Johny Sax MD   Signed by:   Paulo Fruit  BS,CPht II,MPH on 01/01/2010   Method used:   Samples Given   RxID:   [id number]   Patient Assist Medication Verification: Medication: Truvada Lot# [id number] Exp Date:07 2014 Tech approval:MLD                Patient Assist Medication Verification: Medication:Norvir 100mg  GUY#[id number] E21 Exp Date:03 Apr 2011 Tech approval:MLD                Patient Assist Medication Verification: Medication:Reyataz 300mg  Lot#[id number] Exp Date:Oct 2012 Tech approval:MLD Call placed to patient with message that assistance medications are ready for pick-up. Paulo Fruit  BS,CPht II,MPH  January 01, 2010 4:52 PM

## 2010-12-31 NOTE — Progress Notes (Signed)
Summary: [place]ADAP/pt assist meds arrived for Mar  Phone Note Refill Request      Prescriptions: REYATAZ 300 MG  CAPS (ATAZANAVIR SULFATE) Take 1 tablet by mouth once a day  #30 x 0   Entered by:   Paulo Fruit  BS,CPht II,MPH   Authorized by:   Johny Sax MD   Signed by:   Paulo Fruit  BS,CPht II,MPH on 02/05/2010   Method used:   Samples Given   RxID:   [id number] NORVIR 100 MG TABS (RITONAVIR) Take 1 tablet by mouth once a day  #30 x 0   Entered by:   Paulo Fruit  BS,CPht II,MPH   Authorized by:   Johny Sax MD   Signed by:   Paulo Fruit  BS,CPht II,MPH on 02/05/2010   Method used:   Samples Given   RxID:   [id number] TRUVADA 200-300 MG  TABS (EMTRICITABINE-TENOFOVIR) Take 1 tablet by mouth once a day  #30 x 0   Entered by:   Paulo Fruit  BS,CPht II,MPH   Authorized by:   Johny Sax MD   Signed by:   Paulo Fruit  BS,CPht II,MPH on 02/05/2010   Method used:   Samples Given   RxID:   [id number]   Patient Assist Medication Verification: Medication: Reyataz 300mg  Lot#[id number] Exp Date:Nov 2012 Tech approval:MLD                Patient Assist Medication Verification: Medication: Norvir 100mg  ZSW#[id number] E21 Exp Date:03 Apr 2011 Tech approval:MLD                Patient Assist Medication Verification: Medication: Truvada Lot# [id number] Exp Date:05 2014 Tech approval:MLD Call placed to patient with message that assistance medications are ready for pick-up. Also reminded patient to come in soon (preferrably next few days) to renew before the clinic move. Paulo Fruit  BS,CPht II,MPH  February 05, 2010 3:29 PM                   Appended Document: [place]ADAP/pt assist meds arrived for Mar Prescription/Samples picked up by: patient

## 2010-12-31 NOTE — Assessment & Plan Note (Signed)
Summary: 6 MONTH F/U VS   CC:  6 month follow up.  History of Present Illness: 30 yo M from [place] ( in [place] since 2002) who is HIV+, Hep B+. he was previously started on [place] but returned (7-09) with rash and was changed to ATVr/TRV.  most recent CD4 580 and VL 74  (04-24-10). without c/o. No probwith ATVr/TRV.   Preventive Screening-Counseling & Management  Alcohol-Tobacco     Alcohol drinks/day: 0     Smoking Status: current     Smoking Cessation Counseling: yes     Smoke Cessation Stage: quit     Packs/Day: 1.0     Year Started: 1995     Year Quit: 2010  Caffeine-Diet-Exercise     Caffeine use/day: coffee and sodas sometimes     Does Patient Exercise: yes     Type of exercise: push ups     Exercise (avg: min/session): 30-60  Safety-Violence-Falls     Seat Belt Use: yes  Current Medications (verified): 1)  Truvada 200-300 Mg  Tabs (Emtricitabine-Tenofovir) .... Take 1 Tablet By Mouth Once A Day 2)  Norvir 100 Mg Tabs (Ritonavir) .... Take 1 Tablet By Mouth Once A Day 3)  Reyataz 300 Mg  Caps (Atazanavir Sulfate) .... Take 1 Tablet By Mouth Once A Day  Allergies (verified): 1)  ! * Sustiva    Updated Prior Medication List: TRUVADA 200-300 MG  TABS (EMTRICITABINE-TENOFOVIR) Take 1 tablet by mouth once a day NORVIR 100 MG TABS (RITONAVIR) Take 1 tablet by mouth once a day REYATAZ 300 MG  CAPS (ATAZANAVIR SULFATE) Take 1 tablet by mouth once a day  Current Allergies (reviewed today): ! * SUSTIVA Social History: Married-  has quit smoking and now restarted.  Alcohol use-no Drug use-yes  Review of Systems       wt down 2#. eating well. occas probs with hemmerhoids. no sores on rectum.   Vital Signs:  Patient profile:   30 year old male Height:      71 inches (180.34 cm) Weight:      135.0 pounds (61.36 kg) BMI:     18.90 Temp:     98.2 degrees F (36.78 degrees C) oral Pulse rate:   70 / minute BP sitting:   126 / 78  (right arm)  Vitals Entered By: Baxter Hire) (May 20, 2010 2:09 PM) CC: 6 month follow up Pain Assessment Patient in pain? no      Nutritional Status BMI of < 19 = underweight Nutritional Status Detail appetite is pretty good per patient  Have you ever been in a relationship where you felt threatened, hurt or afraid?No   Does patient need assistance? Functional Status Self care Ambulation Normal        Medication Adherence: 05/20/2010   Adherence to medications reviewed with patient. Counseling to provide adequate adherence provided   Prevention For Positives: 05/20/2010   Safe sex practices discussed with patient. Condoms offered.                             Physical Exam  General:  well-developed, well-nourished, and well-hydrated.   Eyes:  pupils equal, pupils round, and pupils reactive to light.   Mouth:  pharynx pink and moist and no exudates.   Neck:  no masses.   Lungs:  normal respiratory effort and normal breath sounds.   Heart:  normal rate, regular rhythm, and no murmur.   Abdomen:  soft, non-tender, and normal bowel sounds.     Impression & Recommendations:  Problem # 1:  HIV INFECTION (ICD-042)  is doing very well. I tried but was unable to ellicit any complaints from him. offered condoms. return to clinic 6 months.   Orders: Est. Patient Level IV (99214)Future Orders: T-CD4SP ([facility]) (CD4SP) ... 08/18/2010 T-HIV Viral Load [phone]) ... 08/18/2010 T-Comprehensive Metabolic Panel [phone]) ... 08/18/2010 T-CBC w/Diff ([phone]) ... 08/18/2010  Problem # 2:  HEPATITIS B (ICD-070.30) will con to f/u LFTs. on 2 active agents. he is hep A immune.

## 2010-12-31 NOTE — Progress Notes (Signed)
Summary: [place]ADAP/pt assist meds arrived for Jan  Phone Note Refill Request      Prescriptions: REYATAZ 300 MG  CAPS (ATAZANAVIR SULFATE) Take 1 tablet by mouth once a day  #30 x 0   Entered by:   Paulo Fruit  BS,CPht II,MPH   Authorized by:   Johny Sax MD   Signed by:   Paulo Fruit  BS,CPht II,MPH on 12/05/2009   Method used:   Samples Given   RxID:   [id number] NORVIR 100 MG TABS (RITONAVIR) Take 1 tablet by mouth once a day  #30 x 0   Entered by:   Paulo Fruit  BS,CPht II,MPH   Authorized by:   Johny Sax MD   Signed by:   Paulo Fruit  BS,CPht II,MPH on 12/05/2009   Method used:   Samples Given   RxID:   [id number] TRUVADA 200-300 MG  TABS (EMTRICITABINE-TENOFOVIR) Take 1 tablet by mouth once a day  #30 x 0   Entered by:   Paulo Fruit  BS,CPht II,MPH   Authorized by:   Johny Sax MD   Signed by:   Paulo Fruit  BS,CPht II,MPH on 12/05/2009   Method used:   Samples Given   RxID:   [id number]   Patient Assist Medication Verification: Medication: Norivr 100mg  HYW#[id number] E21 Exp Date:31 Jan 2011 Tech approval:MLD                Patient Assist Medication Verification: Medication: Reyataz 300mg  YIR#[id number] Exp Date:Sep 2012 Tech approval:MLD                Patient Assist Medication Verification: Medication: Norvir 100mg  [id number]#[id number] Exp Date:05 2014 Tech approval:MLD  **Tried to contact patient with number we have on file---number disconnected** Paulo Fruit  BS,CPht II,MPH  December 05, 2009 3:42 PM

## 2011-01-02 NOTE — Progress Notes (Signed)
Summary: [place] ADAP rxes arrived  Phone Note Outgoing Call   Call placed by: Jennet Maduro RN,  December 04, 2010 9:13 AM Call placed to: Patient Action Taken: Assistance medications ready for pick up    Prescriptions: REYATAZ 300 MG  CAPS (ATAZANAVIR SULFATE) Take 1 tablet by mouth once a day  #30 Capsule x 0   Entered by:   Jennet Maduro RN   Authorized by:   Johny Sax MD   Signed by:   Jennet Maduro RN on 12/04/2010   Method used:   Samples Given   RxID:   [id number] NORVIR 100 MG TABS (RITONAVIR) Take 1 tablet by mouth once a day  #30 Tablet x 0   Entered by:   Jennet Maduro RN   Authorized by:   Johny Sax MD   Signed by:   Jennet Maduro RN on 12/04/2010   Method used:   Samples Given   RxID:   [id number] TRUVADA 200-300 MG  TABS (EMTRICITABINE-TENOFOVIR) Take 1 tablet by mouth once a day  #30 Tablet x 0   Entered by:   Jennet Maduro RN   Authorized by:   Johny Sax MD   Signed by:   Jennet Maduro RN on 12/04/2010   Method used:   Samples Given   RxID:   [id number]

## 2011-01-20 ENCOUNTER — Other Ambulatory Visit (INDEPENDENT_AMBULATORY_CARE_PROVIDER_SITE_OTHER): Payer: Self-pay

## 2011-01-20 ENCOUNTER — Encounter: Payer: Self-pay | Admitting: Infectious Diseases

## 2011-01-20 ENCOUNTER — Other Ambulatory Visit: Payer: Self-pay | Admitting: Adult Health

## 2011-01-20 DIAGNOSIS — B20 Human immunodeficiency virus [HIV] disease: Secondary | ICD-10-CM

## 2011-01-20 LAB — CONVERTED CEMR LAB
ALT: 14 units/L (ref 0–53)
AST: 24 units/L (ref 0–37)
Albumin: 4.5 g/dL (ref 3.5–5.2)
Alkaline Phosphatase: 91 units/L (ref 39–117)
BUN: 17 mg/dL (ref 6–23)
Basophils Absolute: 0 10*3/uL (ref 0.0–0.1)
Basophils Relative: 1 % (ref 0–1)
CO2: 23 meq/L (ref 19–32)
Calcium: 9.4 mg/dL (ref 8.4–10.5)
Chloride: 105 meq/L (ref 96–112)
Creatinine, Ser: 0.98 mg/dL (ref 0.40–1.50)
Eosinophils Absolute: 0.2 10*3/uL (ref 0.0–0.7)
Eosinophils Relative: 3 % (ref 0–5)
Glucose, Bld: 98 mg/dL (ref 70–99)
HCT: 44 % (ref 39.0–52.0)
HIV 1 RNA Quant: 76 copies/mL — ABNORMAL HIGH (ref <20)
HIV-1 RNA Quant, Log: 1.88 — ABNORMAL HIGH (ref <1.30)
Hemoglobin: 15.6 g/dL (ref 13.0–17.0)
Lymphocytes Relative: 44 % (ref 12–46)
Lymphs Abs: 2.5 10*3/uL (ref 0.7–4.0)
MCHC: 35.5 g/dL (ref 30.0–36.0)
MCV: 90.3 fL (ref 78.0–100.0)
Monocytes Absolute: 0.5 10*3/uL (ref 0.1–1.0)
Monocytes Relative: 9 % (ref 3–12)
Neutro Abs: 2.5 10*3/uL (ref 1.7–7.7)
Neutrophils Relative %: 44 % (ref 43–77)
Platelets: 108 10*3/uL — ABNORMAL LOW (ref 150–400)
Potassium: 4.3 meq/L (ref 3.5–5.3)
RBC: 4.87 M/uL (ref 4.22–5.81)
RDW: 11.9 % (ref 11.5–15.5)
Sodium: 139 meq/L (ref 135–145)
Total Bilirubin: 0.4 mg/dL (ref 0.3–1.2)
Total Protein: 7.1 g/dL (ref 6.0–8.3)
WBC: 5.8 10*3/uL (ref 4.0–10.5)

## 2011-01-21 LAB — T-HELPER CELL (CD4) - (RCID CLINIC ONLY)
CD4 % Helper T Cell: 28 % — ABNORMAL LOW (ref 33–55)
CD4 T Cell Abs: 710 uL (ref 400–2700)

## 2011-02-10 ENCOUNTER — Ambulatory Visit: Payer: Self-pay | Admitting: Infectious Diseases

## 2011-02-17 LAB — T-HELPER CELL (CD4) - (RCID CLINIC ONLY)
CD4 % Helper T Cell: 28 % — ABNORMAL LOW (ref 33–55)
CD4 T Cell Abs: 580 uL (ref 400–2700)

## 2011-03-05 LAB — T-HELPER CELL (CD4) - (RCID CLINIC ONLY)
CD4 % Helper T Cell: 21 % — ABNORMAL LOW (ref 33–55)
CD4 T Cell Abs: 500 uL (ref 400–2700)

## 2011-03-08 LAB — T-HELPER CELL (CD4) - (RCID CLINIC ONLY)
CD4 % Helper T Cell: 29 % — ABNORMAL LOW (ref 33–55)
CD4 T Cell Abs: 560 uL (ref 400–2700)

## 2011-03-10 ENCOUNTER — Ambulatory Visit: Payer: Self-pay | Admitting: Infectious Diseases

## 2011-03-10 LAB — T-HELPER CELL (CD4) - (RCID CLINIC ONLY)
CD4 % Helper T Cell: 19 % — ABNORMAL LOW (ref 33–55)
CD4 T Cell Abs: 500 uL (ref 400–2700)

## 2011-03-11 LAB — T-HELPER CELL (CD4) - (RCID CLINIC ONLY)
CD4 % Helper T Cell: 13 % — ABNORMAL LOW (ref 33–55)
CD4 T Cell Abs: 210 uL — ABNORMAL LOW (ref 400–2700)

## 2011-04-09 ENCOUNTER — Ambulatory Visit: Payer: Self-pay

## 2011-04-14 ENCOUNTER — Ambulatory Visit: Payer: Self-pay

## 2011-04-24 ENCOUNTER — Ambulatory Visit: Payer: Self-pay

## 2011-04-29 ENCOUNTER — Ambulatory Visit (INDEPENDENT_AMBULATORY_CARE_PROVIDER_SITE_OTHER): Payer: Self-pay | Admitting: Infectious Diseases

## 2011-04-29 ENCOUNTER — Ambulatory Visit: Payer: Self-pay

## 2011-04-29 ENCOUNTER — Encounter: Payer: Self-pay | Admitting: Infectious Diseases

## 2011-04-29 DIAGNOSIS — Z79899 Other long term (current) drug therapy: Secondary | ICD-10-CM

## 2011-04-29 DIAGNOSIS — B1910 Unspecified viral hepatitis B without hepatic coma: Secondary | ICD-10-CM

## 2011-04-29 DIAGNOSIS — R7611 Nonspecific reaction to tuberculin skin test without active tuberculosis: Secondary | ICD-10-CM

## 2011-04-29 DIAGNOSIS — Z113 Encounter for screening for infections with a predominantly sexual mode of transmission: Secondary | ICD-10-CM

## 2011-04-29 DIAGNOSIS — B20 Human immunodeficiency virus [HIV] disease: Secondary | ICD-10-CM

## 2011-04-29 NOTE — Assessment & Plan Note (Signed)
He is doing well, will restart his meds, meet with assistance personel today. Offered condoms. Will see him back in 4-5 months with labs prior.

## 2011-04-29 NOTE — Assessment & Plan Note (Signed)
Was repeated in ID clinic and was negative. Will watch.

## 2011-04-29 NOTE — Assessment & Plan Note (Signed)
LFTs not elevated at last visit. Will cont to monitor.

## 2011-04-29 NOTE — Progress Notes (Signed)
  Subjective:    Patient ID: Wesley Pearson, male    DOB: [birth year], 30 y.o.   MRN: [id number]  HPI 30 yo M from [place] ( in [place] since 2002) who is HIV+, Hep B+. he was previously started on [place] but returned (7-09) with rash and was changed to ATVr/TRV. Has been going to school, studying biology. Feels well.  Was off his ART for 1 month due to gap in ADAP. Is meeting with Lesle Reek today.  most recent CD4 710 and VL 76 (01-20-11).     Review of Systems  Constitutional: Negative for appetite change.  Gastrointestinal: Negative for diarrhea and constipation.  Genitourinary: Negative for dysuria.       Objective:   Physical Exam  Constitutional: He appears well-developed and well-nourished.  Eyes: EOM are normal. Pupils are equal, round, and reactive to light.  Neck: Neck supple.  Pulmonary/Chest: Effort normal and breath sounds normal. No respiratory distress.  Abdominal: Soft. Bowel sounds are normal. He exhibits no distension.          Assessment & Plan:

## 2011-04-30 ENCOUNTER — Ambulatory Visit: Payer: Self-pay

## 2011-05-02 ENCOUNTER — Ambulatory Visit: Payer: Self-pay

## 2011-05-02 ENCOUNTER — Other Ambulatory Visit: Payer: Self-pay | Admitting: *Deleted

## 2011-05-02 DIAGNOSIS — B20 Human immunodeficiency virus [HIV] disease: Secondary | ICD-10-CM

## 2011-05-02 MED ORDER — ATAZANAVIR SULFATE 300 MG PO CAPS: 300.0000 mg | ORAL_CAPSULE | Freq: Every day | ORAL | Status: DC

## 2011-05-02 MED ORDER — RITONAVIR 100 MG PO TABS: 100.0000 mg | ORAL_TABLET | Freq: Every day | ORAL | Status: DC

## 2011-05-02 MED ORDER — EMTRICITABINE-TENOFOVIR DF 200-300 MG PO TABS: 1.0000 | ORAL_TABLET | Freq: Every day | ORAL | Status: DC

## 2011-07-25 ENCOUNTER — Ambulatory Visit: Payer: Self-pay

## 2011-08-13 ENCOUNTER — Ambulatory Visit: Payer: Self-pay

## 2011-08-21 LAB — T-HELPER CELL (CD4) - (RCID CLINIC ONLY): CD4 % Helper T Cell: 7 — ABNORMAL LOW

## 2011-09-01 LAB — T-HELPER CELL (CD4) - (RCID CLINIC ONLY)
CD4 % Helper T Cell: 24 — ABNORMAL LOW
CD4 T Cell Abs: 570

## 2011-10-20 ENCOUNTER — Other Ambulatory Visit: Payer: Self-pay | Admitting: *Deleted

## 2011-10-20 DIAGNOSIS — B20 Human immunodeficiency virus [HIV] disease: Secondary | ICD-10-CM

## 2011-10-20 MED ORDER — RITONAVIR 100 MG PO TABS: 100.0000 mg | ORAL_TABLET | Freq: Every day | ORAL | Status: DC

## 2011-10-20 MED ORDER — EMTRICITABINE-TENOFOVIR DF 200-300 MG PO TABS: 1.0000 | ORAL_TABLET | Freq: Every day | ORAL | Status: DC

## 2011-10-20 MED ORDER — ATAZANAVIR SULFATE 300 MG PO CAPS: 300.0000 mg | ORAL_CAPSULE | Freq: Every day | ORAL | Status: DC

## 2011-10-30 ENCOUNTER — Encounter: Payer: Self-pay | Admitting: *Deleted

## 2011-11-27 ENCOUNTER — Telehealth: Payer: Self-pay | Admitting: *Deleted

## 2011-11-27 NOTE — Telephone Encounter (Signed)
Called patient to have him schedule a follow up visit and his numbers were not working.

## 2011-11-30 ENCOUNTER — Other Ambulatory Visit: Payer: Self-pay | Admitting: Infectious Diseases

## 2011-12-26 ENCOUNTER — Other Ambulatory Visit: Payer: Self-pay | Admitting: Infectious Diseases

## 2011-12-29 ENCOUNTER — Telehealth: Payer: Self-pay | Admitting: *Deleted

## 2011-12-29 NOTE — Telephone Encounter (Signed)
Spoke with Wesley Pearson at [facility].  Received request for refills on HIV meds, approved 1 refill.  Patient was sent letter to schedule a follow up and he has not done this.  Pharmacy will notify patient and Bridge Counseling referral made. Wendall Mola CMA

## 2012-01-07 ENCOUNTER — Other Ambulatory Visit: Payer: Self-pay

## 2012-01-08 ENCOUNTER — Other Ambulatory Visit (INDEPENDENT_AMBULATORY_CARE_PROVIDER_SITE_OTHER): Payer: Self-pay

## 2012-01-08 DIAGNOSIS — Z113 Encounter for screening for infections with a predominantly sexual mode of transmission: Secondary | ICD-10-CM

## 2012-01-08 DIAGNOSIS — B20 Human immunodeficiency virus [HIV] disease: Secondary | ICD-10-CM

## 2012-01-08 DIAGNOSIS — Z79899 Other long term (current) drug therapy: Secondary | ICD-10-CM

## 2012-01-09 LAB — COMPREHENSIVE METABOLIC PANEL
AST: 18 U/L (ref 0–37)
Albumin: 4.5 g/dL (ref 3.5–5.2)
Alkaline Phosphatase: 81 U/L (ref 39–117)
BUN: 15 mg/dL (ref 6–23)
Creat: 0.91 mg/dL (ref 0.50–1.35)
Glucose, Bld: 92 mg/dL (ref 70–99)
Potassium: 4.5 mEq/L (ref 3.5–5.3)

## 2012-01-09 LAB — LIPID PANEL
HDL: 53 mg/dL (ref >39)
LDL Cholesterol: 75 mg/dL (ref 0–99)
Total CHOL/HDL Ratio: 2.5 Ratio
Triglycerides: 21 mg/dL (ref <150)

## 2012-01-09 LAB — CBC
HCT: 43.2 % (ref 39.0–52.0)
MCHC: 34.7 g/dL (ref 30.0–36.0)
MCV: 93.1 fL (ref 78.0–100.0)
Platelets: 107 10*3/uL — ABNORMAL LOW (ref 150–400)
RDW: 12 % (ref 11.5–15.5)
WBC: 4.3 10*3/uL (ref 4.0–10.5)

## 2012-01-12 LAB — HIV-1 RNA ULTRAQUANT REFLEX TO GENTYP+
HIV 1 RNA Quant: 543 copies/mL — ABNORMAL HIGH (ref <20)
HIV-1 RNA Quant, Log: 2.73 {Log} — ABNORMAL HIGH (ref <1.30)

## 2012-01-20 ENCOUNTER — Ambulatory Visit: Payer: Self-pay | Admitting: Adult Health

## 2012-01-20 ENCOUNTER — Ambulatory Visit: Payer: Self-pay

## 2012-01-22 ENCOUNTER — Ambulatory Visit: Payer: Self-pay

## 2012-01-22 ENCOUNTER — Ambulatory Visit (INDEPENDENT_AMBULATORY_CARE_PROVIDER_SITE_OTHER): Payer: Self-pay | Admitting: Adult Health

## 2012-01-22 ENCOUNTER — Encounter: Payer: Self-pay | Admitting: Adult Health

## 2012-01-22 VITALS — BP 142/74 | HR 84 | Temp 97.9°F | Ht 70.0 in | Wt 134.0 lb

## 2012-01-22 DIAGNOSIS — B20 Human immunodeficiency virus [HIV] disease: Secondary | ICD-10-CM

## 2012-01-22 DIAGNOSIS — Z23 Encounter for immunization: Secondary | ICD-10-CM

## 2012-01-22 NOTE — Assessment & Plan Note (Signed)
He is been off his antiretroviral therapies for her. Approximately 2 months started back about 3 or 4 weeks ago. There still is some detectable virus in this warrants watching. We will continue present management. However, for the time being. Counseling provided on prevention of transmission of HIV. Condoms offered:  Yes Medication adherence discussed with patient. Medication refills ordered as needed. Referrals: Bridge counselor Follow up visit in 6 weeks with labs 2 weeks prior to appointment. Patient verbally acknowledged information provided to them and agreed with plan of care. If there is good. Virologic suppression, we will consider switching him from his current regimen to [facility].

## 2012-01-22 NOTE — Progress Notes (Signed)
Subjective:    Patient ID: Wesley Pearson is a 31 y.o. male.  Chief Complaint: HIV Follow-up Visit Wesley Pearson is here for follow-up of HIV infection. He has not been seen in clinic for over one year, but has had his medications refilled routinely. He states that he has been adherent to his medications until his medications ran out approximately 6 weeks ago. He has been back on his medications and is also being followed by the bridge counselor. He is feeling unchanged since his last visit.  He claims continued adherence to therapy with good tolerance and no complications. There are not additional complaints.   Data Review: Diagnostic studies reviewed.  Review of Systems - General ROS: negative for - chills, fatigue, fever, malaise or night sweats Psychological ROS: negative for - anxiety, behavioral disorder, depression, irritability, memory difficulties or mood swings Ophthalmic ROS: negative ENT ROS: negative Respiratory ROS: no cough, shortness of breath, or wheezing Cardiovascular ROS: no chest pain or dyspnea on exertion Gastrointestinal ROS: no abdominal pain, change in bowel habits, or black or bloody stools Genito-Urinary ROS: no dysuria, trouble voiding, or hematuria Musculoskeletal ROS: negative Neurological ROS: no TIA or stroke symptoms Dermatological ROS: negative  Objective:   General appearance: alert, cooperative and no distress Head: Normocephalic, without obvious abnormality, atraumatic Eyes: conjunctivae/corneas clear. PERRL, EOM's intact. Fundi benign. Ears: normal TM's and external ear canals both ears Throat: lips, mucosa, and tongue normal; teeth and gums normal Resp: clear to auscultation bilaterally Cardio: regular rate and rhythm, S1, S2 normal, no murmur, click, rub or gallop GI: soft, non-tender; bowel sounds normal; no masses,  no organomegaly Pulses: 2+ and symmetric Skin: Skin color, texture, turgor normal. No rashes or lesions Neurologic:  Alert and oriented X 3, normal strength and tone. Normal symmetric reflexes. Normal coordination and gait Psych:  No vegetative signs or delusional behaviors noted.    Laboratory:  HIV 1 RNA Quant (copies/mL)  Date Value  01/08/2012 543*  01/20/2011 76*  05/02/2010 74*     CD4 T Cell Abs (cmm)  Date Value  01/08/2012 420   01/20/2011 710   05/02/2010 580      CD4 % Helper T Cell (%)  Date Value  01/08/2012 27*  01/20/2011 28*  05/02/2010 28*     Hep A Total Ab (no units)  Date Value  12/16/2007 POS*     Hep B S Ab (no units)  Date Value  12/16/2007 NEG      Hepatitis B Surface Ag (no units)  Date Value  12/16/2007 POS*     HCV Ab (no units)  Date Value  12/16/2007 NEG           Assessment/Plan:   HIV INFECTION He is been off his antiretroviral therapies for her. Approximately 2 months started back about 3 or 4 weeks ago. There still is some detectable virus in this warrants watching. We will continue present management. However, for the time being. Counseling provided on prevention of transmission of HIV. Condoms offered:  Yes Medication adherence discussed with patient. Medication refills ordered as needed. Referrals: Bridge counselor Follow up visit in 6 weeks with labs 2 weeks prior to appointment. Patient verbally acknowledged information provided to them and agreed with plan of care. If there is good. Virologic suppression, we will consider switching him from his current regimen to [facility].        Annalia Metzger A. Sundra Aland, MS, [facility] [phone]  01/22/2012, 11:39 AM

## 2012-01-31 ENCOUNTER — Other Ambulatory Visit: Payer: Self-pay | Admitting: Infectious Diseases

## 2012-01-31 DIAGNOSIS — B20 Human immunodeficiency virus [HIV] disease: Secondary | ICD-10-CM

## 2012-02-11 ENCOUNTER — Ambulatory Visit: Payer: Self-pay | Admitting: Infectious Diseases

## 2012-02-13 ENCOUNTER — Other Ambulatory Visit: Payer: Self-pay | Admitting: *Deleted

## 2012-02-13 DIAGNOSIS — B20 Human immunodeficiency virus [HIV] disease: Secondary | ICD-10-CM

## 2012-02-19 ENCOUNTER — Other Ambulatory Visit (INDEPENDENT_AMBULATORY_CARE_PROVIDER_SITE_OTHER): Payer: Self-pay

## 2012-02-19 DIAGNOSIS — B20 Human immunodeficiency virus [HIV] disease: Secondary | ICD-10-CM

## 2012-02-20 LAB — T-HELPER CELL (CD4) - (RCID CLINIC ONLY): CD4 % Helper T Cell: 27 % — ABNORMAL LOW (ref 33–55)

## 2012-02-23 LAB — HIV-1 RNA QUANT-NO REFLEX-BLD: HIV 1 RNA Quant: 181 copies/mL — ABNORMAL HIGH (ref <20)

## 2012-03-04 ENCOUNTER — Ambulatory Visit: Payer: Self-pay | Admitting: Adult Health

## 2012-03-10 ENCOUNTER — Ambulatory Visit (INDEPENDENT_AMBULATORY_CARE_PROVIDER_SITE_OTHER): Payer: Self-pay | Admitting: Infectious Diseases

## 2012-03-10 ENCOUNTER — Encounter: Payer: Self-pay | Admitting: Infectious Diseases

## 2012-03-10 VITALS — BP 129/79 | HR 76 | Temp 98.6°F | Ht 70.0 in | Wt 135.4 lb

## 2012-03-10 DIAGNOSIS — B20 Human immunodeficiency virus [HIV] disease: Secondary | ICD-10-CM

## 2012-03-10 DIAGNOSIS — B1910 Unspecified viral hepatitis B without hepatic coma: Secondary | ICD-10-CM

## 2012-03-10 NOTE — Assessment & Plan Note (Addendum)
He is doing well on his current Rx, with low level viremia. Will see him back in 4-5 months. We discussed changing him to stribild. i discussed the possible renal issues with this. He is reluctant to switch. Will re-address this at his f/u visit.

## 2012-03-10 NOTE — Progress Notes (Signed)
  Subjective:    Patient ID: Wesley Pearson, male    DOB: 13-Aug-1981, 31 y.o.   MRN: [id number]  HPI 31 yo M from [place] ( in [place] since 2002) who is HIV+, Hep B+. he was previously started on [place] but returned (7-09) with rash and was changed to ATVr/TRV. Has had difficulty with adherence and gaps in med coverage. Low level viremia.  HIV 1 RNA Quant (copies/mL)  Date Value  02/19/2012 181*  01/08/2012 543*  01/20/2011 76*     CD4 T Cell Abs (cmm)  Date Value  02/19/2012 410   01/08/2012 420   01/20/2011 710     Has get getting help from [place] and [facility].  Feels well today.     Review of Systems  Constitutional: Negative for fever, chills, appetite change and unexpected weight change.  Respiratory: Negative for cough and shortness of breath.   Gastrointestinal: Negative for nausea and diarrhea.  Genitourinary: Negative for dysuria.       Objective:   Physical Exam  Constitutional: He appears well-developed and well-nourished.  Eyes: EOM are normal. Pupils are equal, round, and reactive to light.  Neck: Neck supple.  Cardiovascular: Normal rate, regular rhythm and normal heart sounds.   Pulmonary/Chest: Effort normal and breath sounds normal.  Abdominal: Soft. Bowel sounds are normal. He exhibits no distension. There is no tenderness.  Lymphadenopathy:    He has no cervical adenopathy.          Assessment & Plan:

## 2012-03-10 NOTE — Assessment & Plan Note (Signed)
Will continue to monitor his LFTs. He is Hep A immune. He is on 2 active hep B drugs currently.

## 2012-08-11 ENCOUNTER — Ambulatory Visit: Payer: Self-pay | Admitting: Infectious Diseases

## 2012-08-18 ENCOUNTER — Ambulatory Visit: Payer: Self-pay

## 2012-09-15 ENCOUNTER — Other Ambulatory Visit: Payer: BC Managed Care – PPO

## 2012-09-15 DIAGNOSIS — B20 Human immunodeficiency virus [HIV] disease: Secondary | ICD-10-CM

## 2012-09-15 LAB — CBC
Hemoglobin: 15.1 g/dL (ref 13.0–17.0)
MCH: 32.2 pg (ref 26.0–34.0)
MCHC: 34.2 g/dL (ref 30.0–36.0)
RDW: 12.4 % (ref 11.5–15.5)

## 2012-09-16 LAB — COMPLETE METABOLIC PANEL WITH GFR
AST: 15 U/L (ref 0–37)
Alkaline Phosphatase: 83 U/L (ref 39–117)
BUN: 11 mg/dL (ref 6–23)
GFR, Est Non African American: >89 mL/min
Glucose, Bld: 95 mg/dL (ref 70–99)
Sodium: 140 mEq/L (ref 135–145)
Total Bilirubin: 2 mg/dL — ABNORMAL HIGH (ref 0.3–1.2)
Total Protein: 6.6 g/dL (ref 6.0–8.3)

## 2012-09-16 LAB — T-HELPER CELL (CD4) - (RCID CLINIC ONLY): CD4 T Cell Abs: 570 uL (ref 400–2700)

## 2012-09-16 LAB — HIV-1 RNA QUANT-NO REFLEX-BLD: HIV 1 RNA Quant: 64 copies/mL — ABNORMAL HIGH (ref <20)

## 2012-09-29 ENCOUNTER — Ambulatory Visit: Payer: Self-pay | Admitting: Infectious Diseases

## 2012-09-30 ENCOUNTER — Telehealth: Payer: Self-pay | Admitting: *Deleted

## 2012-09-30 NOTE — Telephone Encounter (Signed)
Called and left patient a voice mail to call the clinic to reschedule appt, he no showed yesterday. Wesley Pearson

## 2012-12-16 ENCOUNTER — Other Ambulatory Visit: Payer: Self-pay | Admitting: *Deleted

## 2012-12-16 NOTE — Telephone Encounter (Signed)
Opened in error patient advised of wrong patient wanted to change his pharmacy to [facility] in [facility]. Advised him we do not have that location in out data base and to call back with an address and we can change it in our system.

## 2012-12-31 ENCOUNTER — Encounter: Payer: Self-pay | Admitting: *Deleted

## 2012-12-31 ENCOUNTER — Other Ambulatory Visit: Payer: BC Managed Care – PPO

## 2012-12-31 DIAGNOSIS — B20 Human immunodeficiency virus [HIV] disease: Secondary | ICD-10-CM

## 2012-12-31 DIAGNOSIS — Z79899 Other long term (current) drug therapy: Secondary | ICD-10-CM

## 2012-12-31 DIAGNOSIS — Z113 Encounter for screening for infections with a predominantly sexual mode of transmission: Secondary | ICD-10-CM

## 2012-12-31 LAB — CBC WITH DIFFERENTIAL/PLATELET
Basophils Relative: 2 % — ABNORMAL HIGH (ref 0–1)
Eosinophils Absolute: 0.2 10*3/uL (ref 0.0–0.7)
Lymphs Abs: 1.3 10*3/uL (ref 0.7–4.0)
MCH: 31.9 pg (ref 26.0–34.0)
MCHC: 35.5 g/dL (ref 30.0–36.0)
Neutro Abs: 1.4 10*3/uL — ABNORMAL LOW (ref 1.7–7.7)
Neutrophils Relative %: 39 % — ABNORMAL LOW (ref 43–77)
Platelets: 125 10*3/uL — ABNORMAL LOW (ref 150–400)
RBC: 4.8 MIL/uL (ref 4.22–5.81)

## 2012-12-31 LAB — COMPREHENSIVE METABOLIC PANEL
ALT: 138 U/L — ABNORMAL HIGH (ref 0–53)
Alkaline Phosphatase: 98 U/L (ref 39–117)
Potassium: 4.2 mEq/L (ref 3.5–5.3)
Sodium: 137 mEq/L (ref 135–145)
Total Bilirubin: 0.9 mg/dL (ref 0.3–1.2)
Total Protein: 7.2 g/dL (ref 6.0–8.3)

## 2012-12-31 LAB — LIPID PANEL
LDL Cholesterol: 99 mg/dL (ref 0–99)
Total CHOL/HDL Ratio: 2.6 Ratio
VLDL: 6 mg/dL (ref 0–40)

## 2012-12-31 LAB — T-HELPER CELL (CD4) - (RCID CLINIC ONLY)
CD4 % Helper T Cell: 30 % — ABNORMAL LOW (ref 33–55)
CD4 T Cell Abs: 400 uL (ref 400–2700)

## 2013-01-02 LAB — HIV-1 RNA QUANT-NO REFLEX-BLD
HIV 1 RNA Quant: 51 copies/mL — ABNORMAL HIGH (ref <20)
HIV-1 RNA Quant, Log: 1.71 {Log} — ABNORMAL HIGH (ref <1.30)

## 2013-01-12 ENCOUNTER — Ambulatory Visit: Payer: BC Managed Care – PPO | Admitting: Infectious Diseases

## 2013-01-12 ENCOUNTER — Telehealth: Payer: Self-pay | Admitting: *Deleted

## 2013-01-12 NOTE — Telephone Encounter (Signed)
Called patient and left voice mail to call the clinic to reschedule his appt, he no showed today. Wesley Pearson

## 2013-01-17 ENCOUNTER — Encounter: Payer: Self-pay | Admitting: Infectious Diseases

## 2013-01-17 ENCOUNTER — Ambulatory Visit (INDEPENDENT_AMBULATORY_CARE_PROVIDER_SITE_OTHER): Payer: BC Managed Care – PPO | Admitting: Infectious Diseases

## 2013-01-17 VITALS — BP 155/89 | HR 62 | Temp 97.9°F | Ht 70.0 in | Wt 128.0 lb

## 2013-01-17 DIAGNOSIS — Z23 Encounter for immunization: Secondary | ICD-10-CM

## 2013-01-17 DIAGNOSIS — Z79899 Other long term (current) drug therapy: Secondary | ICD-10-CM

## 2013-01-17 DIAGNOSIS — Z113 Encounter for screening for infections with a predominantly sexual mode of transmission: Secondary | ICD-10-CM

## 2013-01-17 DIAGNOSIS — B20 Human immunodeficiency virus [HIV] disease: Secondary | ICD-10-CM

## 2013-01-17 DIAGNOSIS — B1910 Unspecified viral hepatitis B without hepatic coma: Secondary | ICD-10-CM

## 2013-01-17 NOTE — Assessment & Plan Note (Signed)
Continue TRV. He attributes his increased LFTs to recent ETOH use, i cautioned him about this given his hx of Hep B.

## 2013-01-17 NOTE — Progress Notes (Signed)
  Subjective:    Patient ID: Wesley Pearson, male    DOB: [birth year], 32 y.o.   MRN: [id number]  HPI 32 yo [place] from [place] ( in [place] since 2002) who is HIV+, Hep B+. he was previously started on [place] but returned (7-09) with rash and was changed to ATVr/TRV. Has had difficulty with adherence and gaps in med coverage. Low level viremia.  Medicines are going well. Still smoking. BP elevated today, no CP. Has rare headache when he is tired, nothing regular.  No hx of anal sores or warts. Has had some soft BM with urgency to go in AM.     Review of Systems  Constitutional: Negative for appetite change and unexpected weight change.  Cardiovascular: Negative for chest pain.  Gastrointestinal: Negative for diarrhea and constipation.  Genitourinary: Negative for dysuria.       Objective:   Physical Exam  Constitutional: He appears well-developed and well-nourished.  HENT:  Mouth/Throat: No oropharyngeal exudate.  Eyes: EOM are normal. Pupils are equal, round, and reactive to light.  Neck: Neck supple.  Cardiovascular: Normal rate, regular rhythm and normal heart sounds.   Pulmonary/Chest: Effort normal and breath sounds normal.  Abdominal: Soft. Bowel sounds are normal. There is no tenderness.  Lymphadenopathy:    He has no cervical adenopathy.          Assessment & Plan:

## 2013-01-17 NOTE — Assessment & Plan Note (Signed)
Currently doing well, continues to have detectable, low level. Will continue his current rx. Gets  Flu and pnvx today. Offered/refused condoms. Will see him back in 6 months.

## 2013-02-07 ENCOUNTER — Other Ambulatory Visit: Payer: Self-pay | Admitting: *Deleted

## 2013-02-07 DIAGNOSIS — B20 Human immunodeficiency virus [HIV] disease: Secondary | ICD-10-CM

## 2013-02-07 NOTE — Telephone Encounter (Signed)
Received refill request from [facility] in [facility] and tried to call the patient to make sure that is where he wants to have them sent and was not able to get him on the phone left a message for him to call the office and verify pharmacy information

## 2013-02-08 ENCOUNTER — Other Ambulatory Visit: Payer: Self-pay | Admitting: Licensed Clinical Social Worker

## 2013-02-08 DIAGNOSIS — B20 Human immunodeficiency virus [HIV] disease: Secondary | ICD-10-CM

## 2013-02-08 MED ORDER — RITONAVIR 100 MG PO TABS: ORAL_TABLET | ORAL | Status: DC

## 2013-02-08 MED ORDER — EMTRICITABINE-TENOFOVIR DF 200-300 MG PO TABS: ORAL_TABLET | ORAL | Status: DC

## 2013-02-08 MED ORDER — ATAZANAVIR SULFATE 300 MG PO CAPS: 300.0000 mg | ORAL_CAPSULE | Freq: Every day | ORAL | Status: DC

## 2013-02-09 ENCOUNTER — Other Ambulatory Visit: Payer: Self-pay | Admitting: *Deleted

## 2013-02-09 DIAGNOSIS — B20 Human immunodeficiency virus [HIV] disease: Secondary | ICD-10-CM

## 2013-02-09 MED ORDER — ATAZANAVIR SULFATE 300 MG PO CAPS: 300.0000 mg | ORAL_CAPSULE | Freq: Every day | ORAL | Status: DC

## 2013-02-09 MED ORDER — EMTRICITABINE-TENOFOVIR DF 200-300 MG PO TABS: ORAL_TABLET | ORAL | Status: DC

## 2013-02-09 MED ORDER — RITONAVIR 100 MG PO TABS: ORAL_TABLET | ORAL | Status: DC

## 2013-02-09 NOTE — Telephone Encounter (Signed)
Patient called and advised that per his insurance he has to get his Rx from [facility] will send them now.

## 2013-07-18 ENCOUNTER — Other Ambulatory Visit: Payer: BC Managed Care – PPO

## 2013-08-15 ENCOUNTER — Other Ambulatory Visit: Payer: BC Managed Care – PPO

## 2013-08-15 DIAGNOSIS — Z79899 Other long term (current) drug therapy: Secondary | ICD-10-CM

## 2013-08-15 DIAGNOSIS — B20 Human immunodeficiency virus [HIV] disease: Secondary | ICD-10-CM

## 2013-08-15 DIAGNOSIS — B1910 Unspecified viral hepatitis B without hepatic coma: Secondary | ICD-10-CM

## 2013-08-15 DIAGNOSIS — Z113 Encounter for screening for infections with a predominantly sexual mode of transmission: Secondary | ICD-10-CM

## 2013-08-15 LAB — CBC
HCT: 46.2 % (ref 39.0–52.0)
Hemoglobin: 16 g/dL (ref 13.0–17.0)
MCH: 32.7 pg (ref 26.0–34.0)
MCHC: 34.6 g/dL (ref 30.0–36.0)

## 2013-08-15 LAB — LIPID PANEL
HDL: 74 mg/dL (ref >39)
LDL Cholesterol: 68 mg/dL (ref 0–99)
Triglycerides: 43 mg/dL (ref <150)

## 2013-08-15 LAB — COMPREHENSIVE METABOLIC PANEL
Albumin: 4.7 g/dL (ref 3.5–5.2)
Alkaline Phosphatase: 88 U/L (ref 39–117)
BUN: 11 mg/dL (ref 6–23)
Glucose, Bld: 116 mg/dL — ABNORMAL HIGH (ref 70–99)
Potassium: 4.3 mEq/L (ref 3.5–5.3)

## 2013-08-15 LAB — RPR

## 2013-08-16 LAB — HIV-1 RNA QUANT-NO REFLEX-BLD: HIV-1 RNA Quant, Log: <1.3 {Log} (ref <1.30)

## 2013-08-16 LAB — T-HELPER CELL (CD4) - (RCID CLINIC ONLY): CD4 T Cell Abs: 560 /uL (ref 400–2700)

## 2013-08-31 ENCOUNTER — Ambulatory Visit: Payer: BC Managed Care – PPO | Admitting: Infectious Diseases

## 2013-10-06 ENCOUNTER — Other Ambulatory Visit: Payer: Self-pay

## 2013-12-21 ENCOUNTER — Other Ambulatory Visit: Payer: Self-pay | Admitting: *Deleted

## 2013-12-21 DIAGNOSIS — B20 Human immunodeficiency virus [HIV] disease: Secondary | ICD-10-CM

## 2013-12-21 MED ORDER — EMTRICITABINE-TENOFOVIR DF 200-300 MG PO TABS: ORAL_TABLET | ORAL | Status: DC

## 2013-12-21 MED ORDER — RITONAVIR 100 MG PO TABS: ORAL_TABLET | ORAL | Status: DC

## 2013-12-21 MED ORDER — ATAZANAVIR SULFATE 300 MG PO CAPS: 300.0000 mg | ORAL_CAPSULE | Freq: Every day | ORAL | Status: DC

## 2014-02-15 ENCOUNTER — Other Ambulatory Visit: Payer: Self-pay | Admitting: Licensed Clinical Social Worker

## 2014-02-15 DIAGNOSIS — B20 Human immunodeficiency virus [HIV] disease: Secondary | ICD-10-CM

## 2014-02-15 MED ORDER — EMTRICITABINE-TENOFOVIR DF 200-300 MG PO TABS: ORAL_TABLET | ORAL | Status: DC

## 2014-02-15 MED ORDER — ATAZANAVIR SULFATE 300 MG PO CAPS: 300.0000 mg | ORAL_CAPSULE | Freq: Every day | ORAL | Status: DC

## 2014-02-15 MED ORDER — RITONAVIR 100 MG PO TABS: ORAL_TABLET | ORAL | Status: DC

## 2014-03-08 ENCOUNTER — Other Ambulatory Visit: Payer: Self-pay | Admitting: *Deleted

## 2014-03-08 ENCOUNTER — Other Ambulatory Visit: Payer: Self-pay | Admitting: Infectious Diseases

## 2014-03-08 ENCOUNTER — Other Ambulatory Visit: Payer: BC Managed Care – PPO

## 2014-03-08 DIAGNOSIS — B20 Human immunodeficiency virus [HIV] disease: Secondary | ICD-10-CM

## 2014-03-08 LAB — CBC WITH DIFFERENTIAL/PLATELET
Basophils Absolute: 0.1 10*3/uL (ref 0.0–0.1)
Basophils Relative: 1 % (ref 0–1)
Eosinophils Absolute: 0.2 10*3/uL (ref 0.0–0.7)
Eosinophils Relative: 3 % (ref 0–5)
HCT: 43.8 % (ref 39.0–52.0)
Hemoglobin: 15.8 g/dL (ref 13.0–17.0)
LYMPHS ABS: 2.3 10*3/uL (ref 0.7–4.0)
Lymphocytes Relative: 40 % (ref 12–46)
MCH: 32.7 pg (ref 26.0–34.0)
MCHC: 36.1 g/dL — ABNORMAL HIGH (ref 30.0–36.0)
MCV: 90.7 fL (ref 78.0–100.0)
Monocytes Absolute: 0.4 10*3/uL (ref 0.1–1.0)
Monocytes Relative: 7 % (ref 3–12)
NEUTROS PCT: 49 % (ref 43–77)
Neutro Abs: 2.8 10*3/uL (ref 1.7–7.7)
PLATELETS: 171 10*3/uL (ref 150–400)
RBC: 4.83 MIL/uL (ref 4.22–5.81)
RDW: 13 % (ref 11.5–15.5)
WBC: 5.7 10*3/uL (ref 4.0–10.5)

## 2014-03-08 LAB — COMPLETE METABOLIC PANEL WITH GFR
ALT: 17 U/L (ref 0–53)
AST: 26 U/L (ref 0–37)
Albumin: 4.5 g/dL (ref 3.5–5.2)
Alkaline Phosphatase: 86 U/L (ref 39–117)
BUN: 15 mg/dL (ref 6–23)
CALCIUM: 9.7 mg/dL (ref 8.4–10.5)
CHLORIDE: 102 meq/L (ref 96–112)
CO2: 23 mEq/L (ref 19–32)
CREATININE: 0.88 mg/dL (ref 0.50–1.35)
GFR, Est Non African American: >89 mL/min
Glucose, Bld: 66 mg/dL — ABNORMAL LOW (ref 70–99)
Potassium: 4.1 mEq/L (ref 3.5–5.3)
Sodium: 136 mEq/L (ref 135–145)
Total Bilirubin: 0.6 mg/dL (ref 0.2–1.2)
Total Protein: 7.1 g/dL (ref 6.0–8.3)

## 2014-03-09 LAB — T-HELPER CELL (CD4) - (RCID CLINIC ONLY)
CD4 % Helper T Cell: 32 % — ABNORMAL LOW (ref 33–55)
CD4 T CELL ABS: 750 /uL (ref 400–2700)

## 2014-03-10 LAB — HIV-1 RNA QUANT-NO REFLEX-BLD

## 2014-03-15 ENCOUNTER — Other Ambulatory Visit: Payer: Self-pay | Admitting: *Deleted

## 2014-03-15 DIAGNOSIS — B20 Human immunodeficiency virus [HIV] disease: Secondary | ICD-10-CM

## 2014-03-15 MED ORDER — ATAZANAVIR SULFATE 300 MG PO CAPS: 300.0000 mg | ORAL_CAPSULE | Freq: Every day | ORAL | Status: DC

## 2014-03-15 MED ORDER — RITONAVIR 100 MG PO TABS: ORAL_TABLET | ORAL | Status: DC

## 2014-03-15 MED ORDER — EMTRICITABINE-TENOFOVIR DF 200-300 MG PO TABS: ORAL_TABLET | ORAL | Status: DC

## 2014-03-23 ENCOUNTER — Encounter: Payer: Self-pay | Admitting: Infectious Diseases

## 2014-03-23 ENCOUNTER — Ambulatory Visit (INDEPENDENT_AMBULATORY_CARE_PROVIDER_SITE_OTHER): Payer: BC Managed Care – PPO | Admitting: Infectious Diseases

## 2014-03-23 ENCOUNTER — Other Ambulatory Visit: Payer: Self-pay | Admitting: Infectious Diseases

## 2014-03-23 VITALS — BP 124/76 | HR 77 | Temp 97.8°F | Wt 138.0 lb

## 2014-03-23 DIAGNOSIS — Z113 Encounter for screening for infections with a predominantly sexual mode of transmission: Secondary | ICD-10-CM

## 2014-03-23 DIAGNOSIS — B20 Human immunodeficiency virus [HIV] disease: Secondary | ICD-10-CM

## 2014-03-23 DIAGNOSIS — B1910 Unspecified viral hepatitis B without hepatic coma: Secondary | ICD-10-CM

## 2014-03-23 DIAGNOSIS — Z79899 Other long term (current) drug therapy: Secondary | ICD-10-CM

## 2014-03-23 NOTE — Progress Notes (Signed)
   Subjective:    Patient ID: Wesley Pearson, male    DOB: 1981/05/18, 33 y.o.   MRN: [id number]  HPI  33 yo [place] from [place] ( [place] who is HIV+, Hep B+. he was previously started on [place] but returned (7-09) with rash and was changed to ATVr/TRV.  No problems with ARV.   HIV 1 RNA Quant (copies/mL)  Date Value  03/08/2014 <20   08/15/2013 <20   12/31/2012 51*     CD4 T Cell Abs (/uL)  Date Value  03/08/2014 750   08/15/2013 560   12/31/2012 400     Review of Systems  Constitutional: Negative for appetite change and unexpected weight change.  Respiratory: Negative for shortness of breath.   Gastrointestinal: Negative for diarrhea and constipation.  Genitourinary: Negative for difficulty urinating.  Neurological: Negative for headaches.       Objective:   Physical Exam  Constitutional: He appears well-developed and well-nourished.  HENT:  Mouth/Throat: No oropharyngeal exudate.  Eyes: EOM are normal. Pupils are equal, round, and reactive to light.  Neck: Neck supple.  Cardiovascular: Normal rate, regular rhythm and normal heart sounds.   Pulmonary/Chest: Effort normal and breath sounds normal.  Abdominal: Soft. Bowel sounds are normal. There is no tenderness.  Lymphadenopathy:    He has no cervical adenopathy.          Assessment & Plan:

## 2014-03-23 NOTE — Assessment & Plan Note (Signed)
His LFTs are normal. He is Hep A immune.

## 2014-03-23 NOTE — Assessment & Plan Note (Signed)
He is doing very well. Will continue his current ART. Offered/refused condoms. His vax are up to date. Will see him back in 6 months with lipids and rpr as well.

## 2014-05-02 ENCOUNTER — Other Ambulatory Visit: Payer: Self-pay | Admitting: Licensed Clinical Social Worker

## 2014-05-02 DIAGNOSIS — B20 Human immunodeficiency virus [HIV] disease: Secondary | ICD-10-CM

## 2014-05-02 MED ORDER — EMTRICITABINE-TENOFOVIR DF 200-300 MG PO TABS: 1.0000 | ORAL_TABLET | Freq: Every day | ORAL | Status: DC

## 2014-05-02 MED ORDER — ATAZANAVIR SULFATE 300 MG PO CAPS: 300.0000 mg | ORAL_CAPSULE | Freq: Every day | ORAL | Status: DC

## 2014-05-02 MED ORDER — RITONAVIR 100 MG PO TABS: ORAL_TABLET | ORAL | Status: DC

## 2014-12-27 ENCOUNTER — Other Ambulatory Visit: Payer: Self-pay

## 2015-01-10 ENCOUNTER — Other Ambulatory Visit: Payer: BLUE CROSS/BLUE SHIELD

## 2015-01-10 ENCOUNTER — Ambulatory Visit: Payer: Self-pay | Admitting: Infectious Diseases

## 2015-01-10 DIAGNOSIS — Z113 Encounter for screening for infections with a predominantly sexual mode of transmission: Secondary | ICD-10-CM

## 2015-01-10 DIAGNOSIS — B20 Human immunodeficiency virus [HIV] disease: Secondary | ICD-10-CM

## 2015-01-10 DIAGNOSIS — Z79899 Other long term (current) drug therapy: Secondary | ICD-10-CM

## 2015-01-10 LAB — COMPREHENSIVE METABOLIC PANEL
ALT: 14 U/L (ref 0–53)
AST: 20 U/L (ref 0–37)
Albumin: 4.6 g/dL (ref 3.5–5.2)
Alkaline Phosphatase: 80 U/L (ref 39–117)
BUN: 13 mg/dL (ref 6–23)
CALCIUM: 9.6 mg/dL (ref 8.4–10.5)
CHLORIDE: 104 meq/L (ref 96–112)
CO2: 27 meq/L (ref 19–32)
Creat: 0.87 mg/dL (ref 0.50–1.35)
Glucose, Bld: 84 mg/dL (ref 70–99)
Potassium: 4.4 mEq/L (ref 3.5–5.3)
Sodium: 139 mEq/L (ref 135–145)
Total Bilirubin: 0.5 mg/dL (ref 0.2–1.2)
Total Protein: 7 g/dL (ref 6.0–8.3)

## 2015-01-10 LAB — CBC
HCT: 47.4 % (ref 39.0–52.0)
Hemoglobin: 16.2 g/dL (ref 13.0–17.0)
MCH: 32.3 pg (ref 26.0–34.0)
MCHC: 34.2 g/dL (ref 30.0–36.0)
MCV: 94.6 fL (ref 78.0–100.0)
MPV: 10.2 fL (ref 8.6–12.4)
PLATELETS: 179 10*3/uL (ref 150–400)
RBC: 5.01 MIL/uL (ref 4.22–5.81)
RDW: 13.4 % (ref 11.5–15.5)
WBC: 4.8 10*3/uL (ref 4.0–10.5)

## 2015-01-10 LAB — LIPID PANEL
CHOL/HDL RATIO: 2.1 ratio
CHOLESTEROL: 149 mg/dL (ref 0–200)
HDL: 72 mg/dL (ref >39)
LDL Cholesterol: 70 mg/dL (ref 0–99)
Triglycerides: 36 mg/dL (ref <150)
VLDL: 7 mg/dL (ref 0–40)

## 2015-01-11 LAB — RPR

## 2015-01-11 LAB — HIV-1 RNA QUANT-NO REFLEX-BLD: HIV 1 RNA Quant: <20 copies/mL (ref <20)

## 2015-01-11 LAB — T-HELPER CELLS (CD4) COUNT (NOT AT ARMC)
CD4 % Helper T Cell: 28 % — ABNORMAL LOW (ref 33–55)
CD4 T Cell Abs: 480 /uL (ref 400–2700)

## 2015-01-24 ENCOUNTER — Ambulatory Visit (INDEPENDENT_AMBULATORY_CARE_PROVIDER_SITE_OTHER): Payer: BLUE CROSS/BLUE SHIELD | Admitting: Infectious Diseases

## 2015-01-24 ENCOUNTER — Encounter: Payer: Self-pay | Admitting: Infectious Diseases

## 2015-01-24 VITALS — BP 155/90 | HR 70 | Wt 131.0 lb

## 2015-01-24 DIAGNOSIS — B20 Human immunodeficiency virus [HIV] disease: Secondary | ICD-10-CM

## 2015-01-24 DIAGNOSIS — B181 Chronic viral hepatitis B without delta-agent: Secondary | ICD-10-CM

## 2015-01-24 MED ORDER — ELVITEG-COBIC-EMTRICIT-TENOFAF 150-150-200-10 MG PO TABS: 1.0000 | ORAL_TABLET | Freq: Every day | ORAL | Status: DC

## 2015-01-24 NOTE — Assessment & Plan Note (Signed)
Will continue watch LFTs He is Hep A immune.

## 2015-01-24 NOTE — Progress Notes (Signed)
   Subjective:    Patient ID: Wesley Pearson, male    DOB: 02-12-1981, 34 y.o.   MRN: [id number]  HPI 34 yo [place] from [place] ( [place]) who is HIV+, Hep B+. he was previously started on [place] but returned (7-09) with rash and was changed to ATVr/TRV.   Has been doing well. Has 34yo daughter, 626.5 yo daughter, 154 yo daughter, 653 yo son, 6 mo daughter. Has been busy with them.  Wife and kids are all negative.  HIV 1 RNA QUANT (copies/mL)  Date Value  01/10/2015 <20  03/08/2014 <20  08/15/2013 <20   CD4 T CELL ABS (/uL)  Date Value  01/10/2015 480  03/08/2014 750  08/15/2013 560   Review of Systems  Constitutional: Negative for appetite change and unexpected weight change.  Gastrointestinal: Negative for diarrhea and constipation.  Genitourinary: Negative for difficulty urinating.      Objective:   Physical Exam  Constitutional: He appears well-developed and well-nourished.  HENT:  Mouth/Throat: No oropharyngeal exudate.  Eyes: EOM are normal. Pupils are equal, round, and reactive to light.  Neck: Neck supple.  Cardiovascular: Normal rate, regular rhythm and normal heart sounds.   Pulmonary/Chest: Effort normal and breath sounds normal.  Abdominal: Soft. Bowel sounds are normal. He exhibits no distension. There is no tenderness.  Lymphadenopathy:    He has no cervical adenopathy.        Assessment & Plan:

## 2015-01-24 NOTE — Assessment & Plan Note (Signed)
He is doing very well. Will change his ART to improve long term bone health, renal risk.  Offered/refused condoms.  Vaccines are up to date.  rtc in 4 months, 3 months for labs.

## 2015-03-06 ENCOUNTER — Other Ambulatory Visit: Payer: Self-pay | Admitting: *Deleted

## 2015-03-06 DIAGNOSIS — B20 Human immunodeficiency virus [HIV] disease: Secondary | ICD-10-CM

## 2015-03-06 MED ORDER — ELVITEG-COBIC-EMTRICIT-TENOFAF 150-150-200-10 MG PO TABS
1.0000 | ORAL_TABLET | Freq: Every day | ORAL | Status: DC
Start: 2015-03-06 — End: 2015-12-25

## 2015-12-12 ENCOUNTER — Other Ambulatory Visit: Payer: BLUE CROSS/BLUE SHIELD

## 2015-12-12 DIAGNOSIS — B20 Human immunodeficiency virus [HIV] disease: Secondary | ICD-10-CM

## 2015-12-12 LAB — COMPLETE METABOLIC PANEL WITH GFR
ALBUMIN: 4.3 g/dL (ref 3.6–5.1)
ALT: 14 U/L (ref 9–46)
AST: 26 U/L (ref 10–40)
Alkaline Phosphatase: 65 U/L (ref 40–115)
BUN: 20 mg/dL (ref 7–25)
CHLORIDE: 106 mmol/L (ref 98–110)
CO2: 23 mmol/L (ref 20–31)
Calcium: 9.8 mg/dL (ref 8.6–10.3)
Creat: 1.02 mg/dL (ref 0.60–1.35)
GFR, Est African American: >89 mL/min (ref >=60)
GFR, Est Non African American: >89 mL/min (ref >=60)
GLUCOSE: 108 mg/dL — AB (ref 65–99)
POTASSIUM: 4.8 mmol/L (ref 3.5–5.3)
SODIUM: 138 mmol/L (ref 135–146)
Total Bilirubin: 0.5 mg/dL (ref 0.2–1.2)
Total Protein: 6.9 g/dL (ref 6.1–8.1)

## 2015-12-13 LAB — HIV-1 RNA QUANT-NO REFLEX-BLD
HIV 1 RNA QUANT: 165 {copies}/mL — AB (ref <20)
HIV-1 RNA QUANT, LOG: 2.22 {Log_copies}/mL — AB (ref <1.30)

## 2015-12-13 LAB — T-HELPER CELL (CD4) - (RCID CLINIC ONLY)
CD4 % Helper T Cell: 23 % — ABNORMAL LOW (ref 33–55)
CD4 T CELL ABS: 530 /uL (ref 400–2700)

## 2015-12-25 ENCOUNTER — Ambulatory Visit (INDEPENDENT_AMBULATORY_CARE_PROVIDER_SITE_OTHER): Payer: BLUE CROSS/BLUE SHIELD | Admitting: Infectious Diseases

## 2015-12-25 ENCOUNTER — Encounter: Payer: Self-pay | Admitting: Infectious Diseases

## 2015-12-25 VITALS — BP 131/88 | HR 75 | Temp 98.4°F | Ht 70.0 in | Wt 124.0 lb

## 2015-12-25 DIAGNOSIS — Z23 Encounter for immunization: Secondary | ICD-10-CM | POA: Diagnosis not present

## 2015-12-25 DIAGNOSIS — B20 Human immunodeficiency virus [HIV] disease: Secondary | ICD-10-CM | POA: Diagnosis not present

## 2015-12-25 MED ORDER — ATAZANAVIR-COBICISTAT 300-150 MG PO TABS: 1.0000 | ORAL_TABLET | Freq: Every day | ORAL | Status: DC

## 2015-12-25 MED ORDER — EMTRICITABINE-TENOFOVIR AF 200-25 MG PO TABS: 1.0000 | ORAL_TABLET | Freq: Every day | ORAL | Status: DC

## 2015-12-25 NOTE — Assessment & Plan Note (Addendum)
He is doing well Will check his genotype Will change his ART to ATVc/descovy Offered/refused condoms.  Will get him into dental (none since 2008) Will see him back in 3 months

## 2015-12-25 NOTE — Progress Notes (Signed)
   Subjective:    Patient ID: Wesley Pearson, male    DOB: Jun 22, 1981, 35 y.o.   MRN: [id number]  HPI 35 yo M from [place] ( in [place] since 2002) who is HIV+, Hep B+. he was previously started on [place] but returned (7-09) with rash and was changed to ATVr/TRV. At his previous was changed to genvoya but since he felt less energy so he returned to his prev PI rx.   HIV 1 RNA QUANT (copies/mL)  Date Value  12/12/2015 165*  01/10/2015 <20  03/08/2014 <20   CD4 T CELL ABS (/uL)  Date Value  12/12/2015 530  01/10/2015 480  03/08/2014 750   Feels well.  Family is doing well.   Review of Systems  Constitutional: Negative for appetite change and unexpected weight change.  Respiratory: Negative for shortness of breath.   Gastrointestinal: Negative for diarrhea and constipation.  Genitourinary: Negative for difficulty urinating.  Neurological: Negative for headaches.       Objective:   Physical Exam  Constitutional: He appears well-developed and well-nourished.  HENT:  Mouth/Throat: No oropharyngeal exudate.  Eyes: EOM are normal. Pupils are equal, round, and reactive to light.  Neck: Neck supple.  Cardiovascular: Normal rate, regular rhythm and normal heart sounds.   Pulmonary/Chest: Effort normal and breath sounds normal.  Abdominal: Soft. Bowel sounds are normal. There is no tenderness. There is no rebound.  Musculoskeletal: He exhibits no edema.  Lymphadenopathy:    He has no cervical adenopathy.       Assessment & Plan:

## 2015-12-27 LAB — HIV-1 RNA ULTRAQUANT REFLEX TO GENTYP+
HIV 1 RNA Quant: 586 copies/mL — ABNORMAL HIGH (ref <20)
HIV-1 RNA Quant, Log: 2.77 Log copies/mL — ABNORMAL HIGH (ref <1.30)

## 2015-12-27 LAB — HEPATITIS B DNA, ULTRAQUANTITATIVE, PCR: Hepatitis B DNA: <20 IU/mL (ref <20)

## 2016-01-03 LAB — HIV-1 INTEGRASE GENOTYPE

## 2016-01-04 LAB — HLA B*5701: HLA-B*5701 w/rflx HLA-B High: NEGATIVE

## 2016-01-21 ENCOUNTER — Other Ambulatory Visit: Payer: Self-pay | Admitting: *Deleted

## 2016-01-21 ENCOUNTER — Other Ambulatory Visit: Payer: BLUE CROSS/BLUE SHIELD

## 2016-01-21 ENCOUNTER — Telehealth: Payer: Self-pay | Admitting: *Deleted

## 2016-01-21 DIAGNOSIS — B20 Human immunodeficiency virus [HIV] disease: Secondary | ICD-10-CM

## 2016-01-21 MED ORDER — EMTRICITABINE-TENOFOVIR AF 200-25 MG PO TABS
1.0000 | ORAL_TABLET | Freq: Every day | ORAL | Status: DC
Start: 2016-01-21 — End: 2016-05-13

## 2016-01-21 MED ORDER — ATAZANAVIR-COBICISTAT 300-150 MG PO TABS: 1.0000 | ORAL_TABLET | Freq: Every day | ORAL | Status: DC

## 2016-01-21 NOTE — Telephone Encounter (Signed)
Would prefer evotaz, descovy

## 2016-01-21 NOTE — Telephone Encounter (Signed)
Call from [place] with [facility]. Patient called requesting refill on Genvoya which has not been filled in over a month. They have Evotaz and our records show that with Descovy. Should patient restart meds or see MD first. Thanks

## 2016-01-21 NOTE — Telephone Encounter (Signed)
Rx sent to [facility], mail order pharmacy.

## 2016-01-28 ENCOUNTER — Telehealth: Payer: Self-pay | Admitting: *Deleted

## 2016-01-28 NOTE — Telephone Encounter (Signed)
Patient called clinic, left message stating he was having difficulty affording his new regimen. RN returned call, stated that he could use copay cards to cover his portion of the medication.  As patient [place] out of the area, RN activated the [employer] copay card for evotaz -    Group # [id number]  ID# [id number] BIN: [id number] RxPCN: Loyalty Issuer: [phone]  Per Laroy Apple, patient already has an active copay assist card.  Will notify patient of these options when he calls back . Andree Coss, RN

## 2016-03-24 ENCOUNTER — Other Ambulatory Visit: Payer: BLUE CROSS/BLUE SHIELD

## 2016-04-02 ENCOUNTER — Other Ambulatory Visit: Payer: BLUE CROSS/BLUE SHIELD

## 2016-04-16 ENCOUNTER — Ambulatory Visit: Payer: BLUE CROSS/BLUE SHIELD | Admitting: Infectious Diseases

## 2016-05-13 ENCOUNTER — Telehealth: Payer: Self-pay | Admitting: Infectious Diseases

## 2016-05-13 DIAGNOSIS — B20 Human immunodeficiency virus [HIV] disease: Secondary | ICD-10-CM

## 2016-05-13 MED ORDER — EMTRICITABINE-TENOFOVIR AF 200-25 MG PO TABS: 1.0000 | ORAL_TABLET | Freq: Every day | ORAL | Status: DC

## 2016-05-13 MED ORDER — ATAZANAVIR-COBICISTAT 300-150 MG PO TABS: 1.0000 | ORAL_TABLET | Freq: Every day | ORAL | Status: DC

## 2016-05-13 NOTE — Telephone Encounter (Signed)
[facility] left a vm on the medication assistance line requesting refills for descovy and evotaz on 05/09/16.

## 2017-01-13 ENCOUNTER — Other Ambulatory Visit: Payer: Self-pay | Admitting: Infectious Diseases

## 2017-01-13 DIAGNOSIS — B20 Human immunodeficiency virus [HIV] disease: Secondary | ICD-10-CM

## 2017-03-10 ENCOUNTER — Other Ambulatory Visit: Payer: Self-pay | Admitting: Infectious Diseases

## 2017-03-10 DIAGNOSIS — B20 Human immunodeficiency virus [HIV] disease: Secondary | ICD-10-CM

## 2017-07-07 ENCOUNTER — Ambulatory Visit (INDEPENDENT_AMBULATORY_CARE_PROVIDER_SITE_OTHER): Payer: BLUE CROSS/BLUE SHIELD | Admitting: Pharmacist Clinician (PhC)/ Clinical Pharmacy Specialist

## 2017-07-07 ENCOUNTER — Other Ambulatory Visit: Payer: BLUE CROSS/BLUE SHIELD

## 2017-07-07 DIAGNOSIS — B20 Human immunodeficiency virus [HIV] disease: Secondary | ICD-10-CM | POA: Diagnosis not present

## 2017-07-07 DIAGNOSIS — Z23 Encounter for immunization: Secondary | ICD-10-CM | POA: Diagnosis not present

## 2017-07-07 LAB — COMPLETE METABOLIC PANEL WITH GFR
ALK PHOS: 72 U/L (ref 40–115)
ALT: 10 U/L (ref 9–46)
AST: 17 U/L (ref 10–40)
Albumin: 4.4 g/dL (ref 3.6–5.1)
BILIRUBIN TOTAL: 1.6 mg/dL — AB (ref 0.2–1.2)
BUN: 12 mg/dL (ref 7–25)
CO2: 22 mmol/L (ref 20–32)
CREATININE: 0.89 mg/dL (ref 0.60–1.35)
Calcium: 9.6 mg/dL (ref 8.6–10.3)
Chloride: 103 mmol/L (ref 98–110)
Glucose, Bld: 89 mg/dL (ref 65–99)
Potassium: 3.8 mmol/L (ref 3.5–5.3)
Sodium: 140 mmol/L (ref 135–146)
TOTAL PROTEIN: 6.7 g/dL (ref 6.1–8.1)

## 2017-07-07 LAB — CBC
HEMATOCRIT: 43.6 % (ref 38.5–50.0)
Hemoglobin: 15.3 g/dL (ref 13.2–17.1)
MCH: 33.8 pg — AB (ref 27.0–33.0)
MCHC: 35.1 g/dL (ref 32.0–36.0)
MCV: 96.5 fL (ref 80.0–100.0)
MPV: 10.3 fL (ref 7.5–12.5)
Platelets: 194 10*3/uL (ref 140–400)
RBC: 4.52 MIL/uL (ref 4.20–5.80)
RDW: 12.8 % (ref 11.0–15.0)
WBC: 6.7 10*3/uL (ref 3.8–10.8)

## 2017-07-07 MED ORDER — BICTEGRAVIR-EMTRICITAB-TENOFOV 50-200-25 MG PO TABS: 1.0000 | ORAL_TABLET | Freq: Every day | ORAL | 11 refills | Status: DC

## 2017-07-07 MED FILL — BIKTARVY 50-200-25 MG TABS: 50-200-25 | 30 days supply | Qty: 30 | Fill #0

## 2017-07-07 NOTE — Progress Notes (Signed)
HPI: Wesley Pearson is a 36 y.o. male who has not seen us in over a year is here to see pharmacy for labs and adherence discussion.   Allergies: Allergies  Allergen Reactions  . Efavirenz     REACTION: rash    Vitals:    Past Medical History: No past medical history on file.  Social History: Social History   Social History  . Marital status: Married    Spouse name: N/A  . Number of children: N/A  . Years of education: N/A   Social History Main Topics  . Smoking status: Current Every Day Smoker    Packs/day: 0.50    Years: 15.00    Types: Cigarettes  . Smokeless tobacco: Never Used  . Alcohol use 0.0 oz/week     Comment: occasional  . Drug use: No  . Sexual activity: No     Comment: pt. declined condoms   Other Topics Concern  . Not on file   Social History Narrative  . No narrative on file    Previous Regimen: ATP, Genvoya  Current Regimen: ATV/r/Descovy  Labs: HIV 1 RNA Quant (copies/mL)  Date Value  12/25/2015 586 (H)  12/12/2015 165 (H)  01/10/2015 <20   CD4 T Cell Abs (/uL)  Date Value  12/12/2015 530  01/10/2015 480  03/08/2014 750   Hep B S Ab (no units)  Date Value  12/16/2007 NEG   Hepatitis B Surface Ag (no units)  Date Value  12/16/2007 POS (A)   HCV Ab (no units)  Date Value  12/16/2007 NEG    CrCl: CrCl cannot be calculated (Patient's most recent lab result is older than the maximum 21 days allowed.).  Lipids:    Component Value Date/Time   CHOL 149 01/10/2015 1035   TRIG 36 01/10/2015 1035   HDL 72 01/10/2015 1035   CHOLHDL 2.1 01/10/2015 1035   VLDL 7 01/10/2015 1035   LDLCALC 70 01/10/2015 1035    Assessment: Wesley Pearson has not seen us since 1/17 for his HIV. One the main reasons for that is because he lives in [place], [place]. It usually takes him over 2 hrs to get here. He works 3rd shift at a [employer] company 6-7 days a week. He is married and has a child. Pt stated that both are negative for HIV. In the past 6  months, he has missed about 4-5 doses of his ART, mostly due to being tired and forgot to take it. He has been mostly suppressed on the current therapy. Encourage him to not miss doses due to the resistance concern for both HIV and hep B. He has to make this appt because he only has about 4 pills left. Offered to change his ART to [place] and he agreed to do so because it would be easier for him. This regimen would also be good for his hep B treatment. [place] and they will mail it to him monthly. Copay card activated.   Start the Menveo vaccine today. He is scheduled to see stephanie in 2 wks. We will move it out to October to coordinate with his 2nd Menveo vaccine since he lives so far away.   Recommendations:  Change ATV/r/Descovy>>Biktarvy 1 PO qday HIV/hep B labs today F/u with Judeth CornfieldStephanie in Oct Menveo #1 today  Ulyses SouthwardMinh Frieda Arnall, PharmD, BCPS, AAHIVP, CPP Clinical Infectious Disease Pharmacist [facility] 07/07/2017, 10:32 AM

## 2017-07-07 NOTE — Patient Instructions (Addendum)
Labs today Stop Evotaz and UAL CorporationDescovy Start Biktarvy 1 tablet daily  Come back and see us on 10/16

## 2017-07-08 LAB — T-HELPER CELL (CD4) - (RCID CLINIC ONLY)
CD4 % Helper T Cell: 31 % — ABNORMAL LOW (ref 33–55)
CD4 T Cell Abs: 770 /uL (ref 400–2700)

## 2017-07-08 LAB — RPR

## 2017-07-09 LAB — HEPATITIS B SURFACE ANTIGEN: Hepatitis B Surface Ag: REACTIVE — AB

## 2017-07-09 LAB — HEPATITIS B E ANTIBODY: Hepatitis Be Antibody: REACTIVE — AB

## 2017-07-09 LAB — HEPATITIS B E ANTIGEN: Hepatitis Be Antigen: NONREACTIVE

## 2017-07-13 LAB — HIV RNA, RTPCR W/R GT (RTI, PI,INT)
HIV-1 RNA, QN PCR: 130 {copies}/mL — AB
HIV-1 RNA, QN PCR: 2.11 {Log_copies}/mL — AB

## 2017-07-21 ENCOUNTER — Ambulatory Visit: Payer: BLUE CROSS/BLUE SHIELD | Admitting: Infectious Diseases

## 2017-08-04 MED FILL — BIKTARVY 50-200-25 MG TABS: 50-200-25 | 30 days supply | Qty: 30 | Fill #1

## 2017-09-15 ENCOUNTER — Ambulatory Visit: Payer: BLUE CROSS/BLUE SHIELD | Admitting: Infectious Diseases

## 2017-09-21 ENCOUNTER — Ambulatory Visit: Payer: BLUE CROSS/BLUE SHIELD | Admitting: Infectious Diseases

## 2017-09-24 ENCOUNTER — Other Ambulatory Visit: Payer: Self-pay | Admitting: Pharmacist

## 2017-09-24 MED FILL — BIKTARVY 50-200-25 MG TABS: 50-200-25 | 30 days supply | Qty: 30 | Fill #2

## 2017-09-29 ENCOUNTER — Telehealth: Payer: Self-pay | Admitting: Pharmacist Clinician (PhC)/ Clinical Pharmacy Specialist

## 2017-09-29 NOTE — Telephone Encounter (Signed)
Wesley Pearson missed a couple of appts with Wesley Pearson due to car trouble. Rescheduled him for mid Nov.

## 2017-10-16 ENCOUNTER — Ambulatory Visit: Payer: BLUE CROSS/BLUE SHIELD | Admitting: Infectious Diseases

## 2017-10-21 MED FILL — BIKTARVY 50-200-25 MG TABS: 50-200-25 | 30 days supply | Qty: 30 | Fill #3

## 2017-10-30 ENCOUNTER — Telehealth: Payer: Self-pay | Admitting: Pharmacist Clinician (PhC)/ Clinical Pharmacy Specialist

## 2017-10-30 NOTE — Telephone Encounter (Signed)
Tried to call to bring back for an appt. Pt lives in [place] so making the appt has been an issue but he has been taking meds. Couldn't leave a VM because it's full.

## 2017-11-25 MED FILL — BIKTARVY 50-200-25 MG TABS: 50-200-25 | 30 days supply | Qty: 30 | Fill #4

## 2018-01-20 ENCOUNTER — Telehealth: Payer: Self-pay | Admitting: Pharmacy Technician

## 2018-01-20 NOTE — Telephone Encounter (Signed)
[facility] and I left 6 voicemails for Mr. Wesley Pearson asking for a return call about refilling his medication.  To date, no return call.

## 2018-02-08 ENCOUNTER — Telehealth: Payer: Self-pay | Admitting: Pharmacist Clinician (PhC)/ Clinical Pharmacy Specialist

## 2018-02-08 MED FILL — BIKTARVY 50-200-25 MG TABS: 50-200-25 | 30 days supply | Qty: 30 | Fill #5

## 2018-02-08 NOTE — Telephone Encounter (Signed)
Have not been seen since 2017. Has missed multiple appts. Still can't contact him today. D/w Dr. Ninetta LightsHatcher, we will hold refills until seen here by some one.

## 2018-05-11 ENCOUNTER — Telehealth: Payer: Self-pay | Admitting: *Deleted

## 2018-05-11 DIAGNOSIS — Z113 Encounter for screening for infections with a predominantly sexual mode of transmission: Secondary | ICD-10-CM

## 2018-05-11 DIAGNOSIS — B20 Human immunodeficiency virus [HIV] disease: Secondary | ICD-10-CM

## 2018-05-11 DIAGNOSIS — Z79899 Other long term (current) drug therapy: Secondary | ICD-10-CM

## 2018-05-11 MED ORDER — BICTEGRAVIR-EMTRICITAB-TENOFOV 50-200-25 MG PO TABS: 1.0000 | ORAL_TABLET | Freq: Every day | ORAL | 0 refills | Status: DC

## 2018-05-11 MED FILL — BIKTARVY 50-200-25 MG TABS: 50-200-25 | 30 days supply | Qty: 30 | Fill #0

## 2018-05-11 NOTE — Telephone Encounter (Signed)
Patient called to schedule labs, follow up with DR Ninetta LightsHatcher. He took his last biktarvy today, needs refills. RN scheduled patient to labs 6/13, follow up 6/24. He will get 1 more month of medication, understanding that he will get refills at his visit. Lab orders placed. Andree CossHowell, Zoriyah Scheidegger M, RN

## 2018-05-12 ENCOUNTER — Other Ambulatory Visit: Payer: BLUE CROSS/BLUE SHIELD

## 2018-05-12 DIAGNOSIS — B20 Human immunodeficiency virus [HIV] disease: Secondary | ICD-10-CM

## 2018-05-12 DIAGNOSIS — Z113 Encounter for screening for infections with a predominantly sexual mode of transmission: Secondary | ICD-10-CM

## 2018-05-12 DIAGNOSIS — Z79899 Other long term (current) drug therapy: Secondary | ICD-10-CM

## 2018-05-13 ENCOUNTER — Other Ambulatory Visit: Payer: BLUE CROSS/BLUE SHIELD

## 2018-05-13 LAB — CBC WITH DIFFERENTIAL/PLATELET
BASOS PCT: 0.9 %
Basophils Absolute: 74 cells/uL (ref 0–200)
EOS ABS: 369 {cells}/uL (ref 15–500)
Eosinophils Relative: 4.5 %
HCT: 47.7 % (ref 38.5–50.0)
HEMOGLOBIN: 16.7 g/dL (ref 13.2–17.1)
Lymphs Abs: 3239 cells/uL (ref 850–3900)
MCH: 32.6 pg (ref 27.0–33.0)
MCHC: 35 g/dL (ref 32.0–36.0)
MCV: 93 fL (ref 80.0–100.0)
MONOS PCT: 8.3 %
MPV: 10.7 fL (ref 7.5–12.5)
NEUTROS ABS: 3838 {cells}/uL (ref 1500–7800)
Neutrophils Relative %: 46.8 %
PLATELETS: 200 10*3/uL (ref 140–400)
RBC: 5.13 10*6/uL (ref 4.20–5.80)
RDW: 12.2 % (ref 11.0–15.0)
Total Lymphocyte: 39.5 %
WBC mixed population: 681 cells/uL (ref 200–950)
WBC: 8.2 10*3/uL (ref 3.8–10.8)

## 2018-05-13 LAB — COMPLETE METABOLIC PANEL WITH GFR
AG Ratio: 1.8 (calc) (ref 1.0–2.5)
ALBUMIN MSPROF: 4.6 g/dL (ref 3.6–5.1)
ALT: 10 U/L (ref 9–46)
AST: 16 U/L (ref 10–40)
Alkaline phosphatase (APISO): 79 U/L (ref 40–115)
BILIRUBIN TOTAL: 0.3 mg/dL (ref 0.2–1.2)
BUN: 11 mg/dL (ref 7–25)
CALCIUM: 9.7 mg/dL (ref 8.6–10.3)
CO2: 25 mmol/L (ref 20–32)
CREATININE: 0.83 mg/dL (ref 0.60–1.35)
Chloride: 103 mmol/L (ref 98–110)
GFR, EST AFRICAN AMERICAN: 131 mL/min/{1.73_m2} (ref >=60)
GFR, EST NON AFRICAN AMERICAN: 113 mL/min/{1.73_m2} (ref >=60)
GLUCOSE: 80 mg/dL (ref 65–99)
Globulin: 2.5 g/dL (calc) (ref 1.9–3.7)
Potassium: 4.1 mmol/L (ref 3.5–5.3)
Sodium: 138 mmol/L (ref 135–146)
TOTAL PROTEIN: 7.1 g/dL (ref 6.1–8.1)

## 2018-05-13 LAB — LIPID PANEL
CHOLESTEROL: 161 mg/dL (ref <200)
HDL: 67 mg/dL (ref >40)
LDL Cholesterol (Calc): 81 mg/dL (calc)
Non-HDL Cholesterol (Calc): 94 mg/dL (calc) (ref <130)
TRIGLYCERIDES: 58 mg/dL (ref <150)
Total CHOL/HDL Ratio: 2.4 (calc) (ref <5.0)

## 2018-05-13 LAB — T-HELPER CELL (CD4) - (RCID CLINIC ONLY)
CD4 T CELL ABS: 1020 /uL (ref 400–2700)
CD4 T CELL HELPER: 30 % — AB (ref 33–55)

## 2018-05-13 LAB — RPR: RPR Ser Ql: NONREACTIVE

## 2018-05-14 LAB — HIV-1 RNA QUANT-NO REFLEX-BLD
HIV 1 RNA QUANT: 84 {copies}/mL — AB
HIV-1 RNA Quant, Log: 1.92 Log copies/mL — ABNORMAL HIGH

## 2018-05-24 ENCOUNTER — Encounter: Payer: BLUE CROSS/BLUE SHIELD | Admitting: Infectious Diseases

## 2018-05-31 ENCOUNTER — Telehealth: Payer: Self-pay

## 2018-05-31 ENCOUNTER — Encounter: Payer: BLUE CROSS/BLUE SHIELD | Admitting: Infectious Diseases

## 2018-05-31 NOTE — Telephone Encounter (Signed)
Patient called to cancel appointment for 05-31-18 and would like to reschedule.  Call returned no answer and no voicemail.   Laurell Josephsammy K King, RN

## 2018-06-14 MED FILL — BIKTARVY 50-200-25 MG TABS: 50-200-25 | 30 days supply | Qty: 30 | Fill #6

## 2018-07-08 ENCOUNTER — Other Ambulatory Visit: Payer: Self-pay | Admitting: Infectious Diseases

## 2018-08-17 ENCOUNTER — Telehealth: Payer: Self-pay | Admitting: Pharmacy Technician

## 2018-08-17 NOTE — Telephone Encounter (Signed)
Left multiple voicemails to refill HIV meds.  No return phone calls.  No showed last appointment and is past due for a clinic check up and labs

## 2018-09-02 ENCOUNTER — Other Ambulatory Visit: Payer: Self-pay | Admitting: *Deleted

## 2018-09-02 DIAGNOSIS — B20 Human immunodeficiency virus [HIV] disease: Secondary | ICD-10-CM

## 2018-09-02 MED ORDER — BICTEGRAVIR-EMTRICITAB-TENOFOV 50-200-25 MG PO TABS: 1.0000 | ORAL_TABLET | Freq: Every day | ORAL | 0 refills | Status: AC

## 2018-09-07 ENCOUNTER — Ambulatory Visit: Payer: BLUE CROSS/BLUE SHIELD | Admitting: Family

## 2018-09-20 ENCOUNTER — Other Ambulatory Visit: Payer: Self-pay

## 2018-09-20 ENCOUNTER — Other Ambulatory Visit: Payer: BLUE CROSS/BLUE SHIELD

## 2018-09-20 DIAGNOSIS — B20 Human immunodeficiency virus [HIV] disease: Secondary | ICD-10-CM

## 2018-09-20 DIAGNOSIS — Z79899 Other long term (current) drug therapy: Secondary | ICD-10-CM

## 2018-09-20 DIAGNOSIS — Z113 Encounter for screening for infections with a predominantly sexual mode of transmission: Secondary | ICD-10-CM

## 2018-10-04 ENCOUNTER — Ambulatory Visit: Payer: BLUE CROSS/BLUE SHIELD | Admitting: Family

## 2021-02-04 ENCOUNTER — Telehealth: Payer: Self-pay

## 2021-02-04 NOTE — Telephone Encounter (Signed)
Patient transferring care to [facility] in [place], [place]. Office notes, labs, immunization records and face sheet faxed to office. [phone]  Rosanna Randy, RN
